# Patient Record
Sex: Female | Born: 1950 | Race: White | Hispanic: No | Marital: Married | Smoking: Never smoker
Health system: Southern US, Community
[De-identification: ages and names within clinical notes are randomized; demographics above are authoritative.]

## PROBLEM LIST (undated history)

## (undated) DIAGNOSIS — E119 Type 2 diabetes mellitus without complications: Secondary | ICD-10-CM

## (undated) DIAGNOSIS — E7800 Pure hypercholesterolemia, unspecified: Secondary | ICD-10-CM

## (undated) HISTORY — DX: Pure hypercholesterolemia, unspecified: E78.00

## (undated) HISTORY — DX: Type 2 diabetes mellitus without complications: E11.9

## (undated) HISTORY — PX: ROTATOR CUFF REPAIR: SHX139

---

## 1999-01-22 ENCOUNTER — Other Ambulatory Visit: Admission: RE | Admit: 1999-01-22 | Discharge: 1999-01-22 | Payer: Self-pay | Admitting: Internal Medicine

## 1999-10-08 HISTORY — PX: ROTATOR CUFF REPAIR: SHX139

## 2001-04-13 ENCOUNTER — Other Ambulatory Visit: Admission: RE | Admit: 2001-04-13 | Discharge: 2001-04-13 | Payer: Self-pay | Admitting: Internal Medicine

## 2002-09-20 ENCOUNTER — Encounter: Payer: Self-pay | Admitting: Internal Medicine

## 2006-03-11 ENCOUNTER — Ambulatory Visit: Payer: Self-pay | Admitting: Internal Medicine

## 2009-02-15 ENCOUNTER — Ambulatory Visit: Payer: Self-pay | Admitting: Internal Medicine

## 2009-02-15 ENCOUNTER — Observation Stay (HOSPITAL_COMMUNITY): Admission: EM | Admit: 2009-02-15 | Discharge: 2009-02-16 | Payer: Self-pay | Admitting: Cardiovascular Disease

## 2009-03-30 ENCOUNTER — Telehealth: Payer: Self-pay | Admitting: Internal Medicine

## 2009-04-03 ENCOUNTER — Ambulatory Visit: Payer: Self-pay | Admitting: Gastroenterology

## 2009-04-03 DIAGNOSIS — K648 Other hemorrhoids: Secondary | ICD-10-CM

## 2009-04-03 DIAGNOSIS — K625 Hemorrhage of anus and rectum: Secondary | ICD-10-CM

## 2009-04-03 DIAGNOSIS — E119 Type 2 diabetes mellitus without complications: Secondary | ICD-10-CM

## 2009-04-03 HISTORY — DX: Type 2 diabetes mellitus without complications: E11.9

## 2009-04-03 HISTORY — DX: Hemorrhage of anus and rectum: K62.5

## 2009-04-03 HISTORY — DX: Other hemorrhoids: K64.8

## 2009-04-06 LAB — CONVERTED CEMR LAB
Basophils Relative: 1.1 % (ref 0.0–3.0)
Eosinophils Absolute: 0.3 10*3/uL (ref 0.0–0.7)
Eosinophils Relative: 4.6 % (ref 0.0–5.0)
HCT: 34.8 % — ABNORMAL LOW (ref 36.0–46.0)
Hemoglobin: 12.1 g/dL (ref 12.0–15.0)
MCHC: 34.7 g/dL (ref 30.0–36.0)
MCV: 101.1 fL — ABNORMAL HIGH (ref 78.0–100.0)
Monocytes Absolute: 0.5 10*3/uL (ref 0.1–1.0)
Neutro Abs: 3.6 10*3/uL (ref 1.4–7.7)
Neutrophils Relative %: 61.5 % (ref 43.0–77.0)
RBC: 3.44 M/uL — ABNORMAL LOW (ref 3.87–5.11)
WBC: 6 10*3/uL (ref 4.5–10.5)

## 2009-04-21 ENCOUNTER — Ambulatory Visit: Payer: Self-pay | Admitting: Gastroenterology

## 2009-04-24 LAB — CONVERTED CEMR LAB
Fecal Occult Blood: NEGATIVE
OCCULT 2: NEGATIVE
OCCULT 5: NEGATIVE

## 2011-01-15 LAB — GLUCOSE, CAPILLARY
Glucose-Capillary: 211 mg/dL — ABNORMAL HIGH (ref 70–99)
Glucose-Capillary: 239 mg/dL — ABNORMAL HIGH (ref 70–99)
Glucose-Capillary: 27 mg/dL — CL (ref 70–99)
Glucose-Capillary: 54 mg/dL — ABNORMAL LOW (ref 70–99)

## 2011-01-15 LAB — CARDIAC PANEL(CRET KIN+CKTOT+MB+TROPI)
CK, MB: 1.4 ng/mL (ref 0.3–4.0)
CK, MB: 1.5 ng/mL (ref 0.3–4.0)
Relative Index: INVALID (ref 0.0–2.5)
Relative Index: INVALID (ref 0.0–2.5)
Total CK: 45 U/L (ref 7–177)
Troponin I: 0.01 ng/mL (ref 0.00–0.06)
Troponin I: 0.01 ng/mL (ref 0.00–0.06)

## 2011-01-15 LAB — LIPID PANEL
HDL: 57 mg/dL (ref >39)
Total CHOL/HDL Ratio: 3 RATIO
VLDL: 7 mg/dL (ref 0–40)

## 2011-01-15 LAB — CBC
RBC: 3.53 MIL/uL — ABNORMAL LOW (ref 3.87–5.11)
WBC: 4.5 10*3/uL (ref 4.0–10.5)

## 2011-02-19 NOTE — Cardiovascular Report (Signed)
NAMEBEVERLYN, MCGINNESS NO.:  [id number]   MEDICAL RECORD NO.:  [id number]          PATIENT TYPE:  INP   LOCATION:  2039                         FACILITY:  [facility]   PHYSICIAN:  Vesta Mixer, M.D. DATE OF BIRTH:  [birth year]   DATE OF PROCEDURE:  02/16/2009  DATE OF DISCHARGE:  02/16/2009                            CARDIAC CATHETERIZATION   Deborah [name] is a 60 year old female with a 31-year history of diabetes  mellitus.  She presents last night with a 2-week episode of intermittent  chest pain.  She was admitted to my service for further evaluation.  Because of risk factors and the persistent nature the pain, we have  decided to do a heart catheterization for further evaluation.   Right femoral artery was easily cannulated using modified Seldinger  technique.   HEMODYNAMICS:  The blood pressure was 90/43.  Her LV pressure was 88/15.  The aortic pressure was 88/43.   ANGIOGRAPHY:  The left main:  The left main is smooth and normal.   The left anterior descending artery is smooth and normal.  It gives off  several small diagonal arteries, which are all normal.   The left circumflex artery is fairly large vessel.  It gives off a large  first obtuse marginal branch, which is somewhat tortuous but is  otherwise normal.  The remaining left circumflex artery is tortuous but  is otherwise normal.   The right coronary artery has a very anterior takeoff.  It is smooth and  normal throughout its course.  The posterior descending artery and the  posterolateral segment of the artery are normal.   The left ventriculogram was performed in a 30-RAO position.  It reveals  vigorous left ventricular systolic function.  Ejection fraction 65-70%.  There are no segmental wall motion abnormalities.   COMPLICATIONS:  None.   CONCLUSIONS:  1. Smooth and normal coronary arteries.  2. Normal left ventricular systolic function.  She will continue with      medical therapy.   She should be able to be discharged to home      tonight.      Vesta Mixer, M.D.  Electronically Signed     PJN/MEDQ  D:  02/16/2009  T:  02/17/2009  Job:  [id number]   cc:   Gwen Pounds, MD

## 2011-02-19 NOTE — Discharge Summary (Signed)
NAMEMELLISA, [name] NO.:  [id number]   MEDICAL RECORD NO.:  [id number]          PATIENT TYPE:  INP   LOCATION:  2039                         FACILITY:  [facility]   PHYSICIAN:  Vesta Mixer, M.D. DATE OF BIRTH:  [birth year]   DATE OF ADMISSION:  02/15/2009  DATE OF DISCHARGE:  02/16/2009                               DISCHARGE SUMMARY   DISCHARGE DIAGNOSES:  1. Noncardiac chest pain.  2. Type 2 diabetes mellitus.  3. Dyslipidemia.   DISCHARGE MEDICATIONS:  1. Lipitor 20 mg a day.  2. Monopril 5 mg a day.  3. Aspirin 81 mg a day.  4. Insulin pump as directed by Dr. Timothy Lasso.   DISPOSITION:  The patient will see Dr. Elease Hashimoto in 1 week for a groin  check.  She will see Dr. Timothy Lasso for further medical problems.   HISTORY:  Deborah [name] is a 60 year old female with a 2-week history of  chest discomfort.  She was originally scheduled to see me in the office  today, but was admitted last night through the emergency room.  She was  admitted in transfer from [facility].  Please see dictated H&P  for further details.   HOSPITAL COURSE:  1. Chest pain.  The patient ruled out for myocardial infarction.      Because of her history of severe diabetes and a family history of      coronary artery disease, we elected to proceed with heart      catheterization.  She had a heart catheterization which revealed      smooth and normal coronary arteries.  She had normal left      ventricular systolic function.  She will follow up with Dr. Elease Hashimoto      for a groin check in a week or so.  2. Diabetes mellitus.  The patient's glucose is fairly labile.  We      discontinued the insulin pump transiently to do a heart      catheterization, but because she had been n.p.o. her sugar dropped      to 64 and eventually 27.  She was given D50.  Her IV hydration was      changed to D5 normal saline.  The patient's followup glucose levels      were 211.  We will continue to check her glucoses  through the day      since she has not eaten regularly.  This will be followed up by Dr.      Timothy Lasso.  All of her other medical problems remained stable.      Vesta Mixer, M.D.  Electronically Signed     PJN/MEDQ  D:  02/16/2009  T:  02/17/2009  Job:  [id number]   cc:   Gwen Pounds, MD

## 2011-02-19 NOTE — H&P (Signed)
Deborah [name], Deborah [name] NO.:  [id number]   MEDICAL RECORD NO.:  [id number]          PATIENT TYPE:  INP   LOCATION:  2922                         FACILITY:  [facility]   PHYSICIAN:  Wendi Snipes, MD DATE OF BIRTH:  [birth year]   DATE OF ADMISSION:  02/15/2009  DATE OF DISCHARGE:                              HISTORY & PHYSICAL   PRIMARY CARE PHYSICIAN:  Gwen Pounds, MD   CHIEF COMPLAINT:  Chest pain.   HISTORY OF PRESENT ILLNESS:  This is a 60 year old white female with a  history of insulin-dependent diabetes and hyperlipidemia who presents  with chest pain to [facility] today.  She states that she has  been experiencing left chest wall pain for the past 2 weeks.  She states  that these particular symptoms are intermittent, not associated with  exertion, palpitations, syncope, presyncope, increased lower extremity  edema and has some component of a pleuritic nature.  She was scheduled  to see Dr. Elease Hashimoto tomorrow for these symptoms.  However, today she  experienced new symptoms that are described as mid chest pressure that  occurred at rest.  The symptoms were quite severe and it prompted the  patient to report to the emergency department for further evaluation  management.   PAST MEDICAL HISTORY:  1. Hyperlipidemia.  2. Insulin-dependent diabetes.   ALLERGIES:  NO KNOWN DRUG ALLERGIES.   MEDICATIONS ON ADMISSION:  1. Monopril 5 mg daily.  2. Lipitor 20 mg daily.  3. Aspirin 81 mg daily.  4. Insulin pump Humalog as directed.   SOCIAL HISTORY:  She lives in [place].  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   FAMILY HISTORY:  Her father had a coronary artery bypass surgery in his  78s, and her grandfather had an MI when he was 34 years old   REVIEW OF SYSTEMS:  All 14-systems were reviewed and were negative  except as mentioned in detail in HPI.   PHYSICAL EXAMINATION:  VITAL SIGNS:  Blood pressure is 98/51, pulse is  62  beats per minute.  She is breathing 16 times per minute.  She is  afebrile.  She is satting 100% on 2 liters nasal cannula.  GENERAL:  She is a 60 year old white female appearing stated age.  No  acute distress.  HEENT:  Moist mucous membranes.  Pupils are equal, round, react to light  and accommodation.  Anicteric sclera.  NECK:  No jugular venous distention.  No thyromegaly.  CARDIOVASCULAR:  Regular rate and rhythm.  No murmurs, rubs or gallops.  LUNGS:  Clear to auscultation bilaterally.  ABDOMEN:  Nontender, nondistended.  Positive bowel sounds.  No masses.  EXTREMITIES:  No clubbing, cyanosis, edema, 2+ pulses throughout.  NEUROLOGIC:  Alert and x3.  Cranial nerves II-XII grossly intact.  No  focal neurologic deficits.  SKIN:  Warm, dry and intact.  No rashes.  PSYCHIATRIC:  Mood and affect are appropriate.   RADIOLOGY:  Chest x-ray in outside hospital was within normal limits.  No acute process.  EKG showed normal sinus rhythm with  a rate of 69  beats per minute with no ST or T-wave abnormalities suggestive of  chronic or recent ischemia.  Normal EKG.   LABORATORY DATA:  White blood cell count is 5.8, hematocrit is 34,  platelet count is 198.  Potassium is 4.4, creatinine is 0.74.  Her blood  sugar of 77.  Her D-dimer is 119.  Her BNP is 61.  Her INR is 1.0.   ASSESSMENT/PLAN:  A 60 year old white female with diabetes and  hyperlipidemia here with chest pain that is somewhat atypical though  concerning in context of her risk factors.  1. Chest pain.  Will treat her as unstable angina.  At the time being,      will start enoxaparin and rule out MI with serial cardiac enzymes.      Will plan for noninvasive risk stratification in the morning if she      does rule out.  Her left chest wall pain is slightly pleuritic and      may represent etiology other than coronary artery disease. Consider      further out patient follow up for non-cardiac chest pain.  2. Hyperlipidemia.  She  is currently on statin therapy.  Will check a      morning fasting lipid panel.  3. Insulin-dependent diabetes.  Will continue her current dose of her      insulin pump.  The patient reports that her A1c is approximately 7.      Wendi Snipes, MD  Electronically Signed     BHH/MEDQ  D:  02/15/2009  T:  02/16/2009  Job:  [id number]

## 2013-08-17 ENCOUNTER — Encounter: Payer: Self-pay | Admitting: Internal Medicine

## 2013-10-22 ENCOUNTER — Ambulatory Visit (AMBULATORY_SURGERY_CENTER): Payer: Self-pay

## 2013-10-22 ENCOUNTER — Telehealth: Payer: Self-pay

## 2013-10-22 VITALS — Ht 64.0 in | Wt 185.0 lb

## 2013-10-22 DIAGNOSIS — Z1211 Encounter for screening for malignant neoplasm of colon: Secondary | ICD-10-CM

## 2013-10-22 MED ORDER — MOVIPREP 100 G PO SOLR: 1.0000 | Freq: Once | ORAL | Status: DC

## 2013-10-22 NOTE — Telephone Encounter (Signed)
Pt had PV on 10/22/13, colon on the 30th of Jan with DR Olevia Perches.  Pt has insulin pump.  Please check with Shon Baton and contact pt regarding her instructions for the insulin pump.  Thank you!

## 2013-10-22 NOTE — Telephone Encounter (Signed)
Letter sent to Dr. Virgina Jock.

## 2013-10-27 NOTE — Telephone Encounter (Signed)
Spoke with Melissa at Dr. Keane Police office and faxed letter to [phone].

## 2013-10-28 ENCOUNTER — Encounter: Payer: Self-pay | Admitting: Internal Medicine

## 2013-11-01 NOTE — Telephone Encounter (Signed)
Received faxed letter from Dr. Virgina Jock with instructions for patient to go to 50% basal at 8 PM the night before. Bolus gently when CBG's >250.

## 2013-11-01 NOTE — Telephone Encounter (Signed)
Spoke with patient and reviewed instructions with her. Copy to Lillie Columbia for endo chart and sent letter to be scanned in also.

## 2013-11-05 ENCOUNTER — Telehealth: Payer: Self-pay | Admitting: *Deleted

## 2013-11-05 ENCOUNTER — Encounter: Payer: Self-pay | Admitting: Internal Medicine

## 2013-11-05 ENCOUNTER — Other Ambulatory Visit: Payer: Self-pay | Admitting: *Deleted

## 2013-11-05 ENCOUNTER — Ambulatory Visit (AMBULATORY_SURGERY_CENTER): Payer: Managed Care, Other (non HMO) | Admitting: Internal Medicine

## 2013-11-05 ENCOUNTER — Ambulatory Visit
Admission: RE | Admit: 2013-11-05 | Discharge: 2013-11-05 | Disposition: A | Discharge status: Home / Self Care | Payer: Managed Care, Other (non HMO) | Source: Ambulatory Visit | Attending: Internal Medicine | Admitting: Internal Medicine

## 2013-11-05 VITALS — BP 121/64 | HR 69 | Temp 97.7°F | Resp 16 | Ht 64.0 in | Wt 185.0 lb

## 2013-11-05 DIAGNOSIS — Q438 Other specified congenital malformations of intestine: Secondary | ICD-10-CM

## 2013-11-05 DIAGNOSIS — Z1211 Encounter for screening for malignant neoplasm of colon: Secondary | ICD-10-CM

## 2013-11-05 IMAGING — RF DG BE W/ CM - WO/W KUB
18 of 24 series · 18 of 24 positions shown · non-contrast
Comparison: None.

FLUOROSCOPY TIME:  1 min 48 seconds.

CLINICAL DATA: Incomplete colonoscopy.

EXAM:
SINGLE COLUMN BARIUM ENEMA
TECHNIQUE: Initial scout AP supine abdominal image obtained to insure adequate
colon cleansing. Barium was introduced into the colon in a
retrograde fashion and refluxed from the rectum to the cecum. Spot
images of the colon followed by overhead radiographs were obtained.

[Series 1: run · 1 of 1 slices shown (1 of 17)]
[im 1/1]
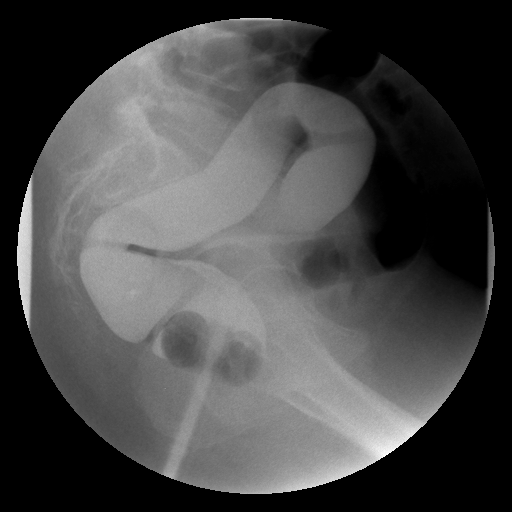

[Series 2: run · 1 of 1 slices shown (2 of 17)]
[im 1/1]
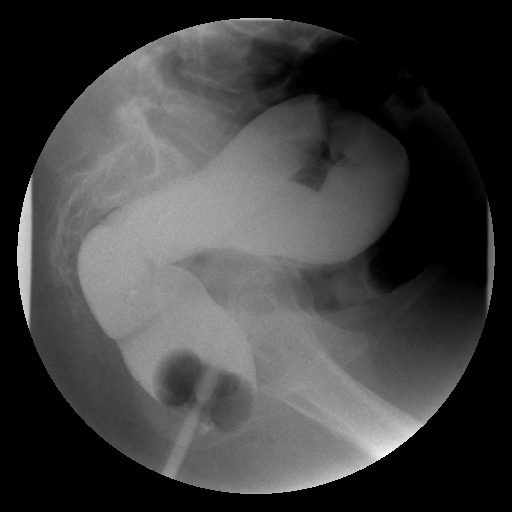

[Series 3: run · 1 of 1 slices shown (3 of 17)]
[im 1/1]
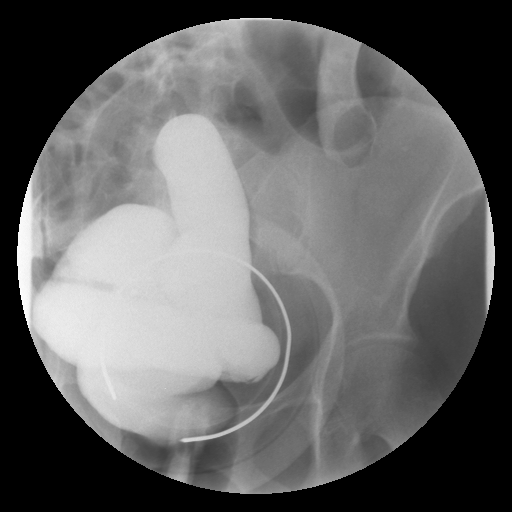

[Series 4: run · 1 of 1 slices shown (4 of 17)]
[im 1/1]
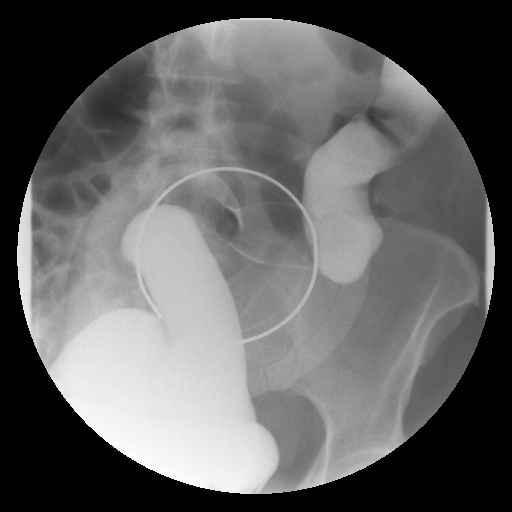

[Series 5: run · 1 of 1 slices shown (5 of 17)]
[im 1/1]
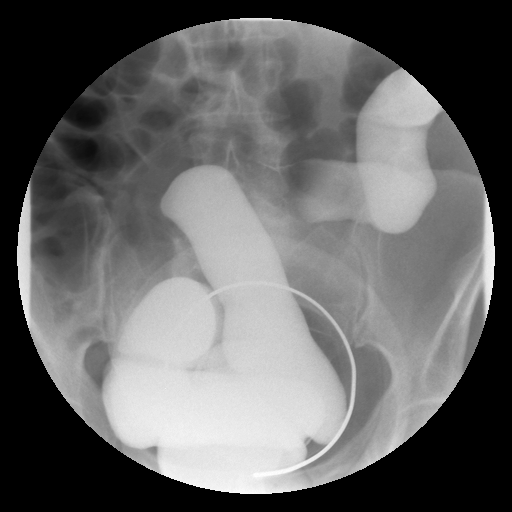

[Series 6: run · 1 of 1 slices shown (6 of 17)]
[im 1/1]
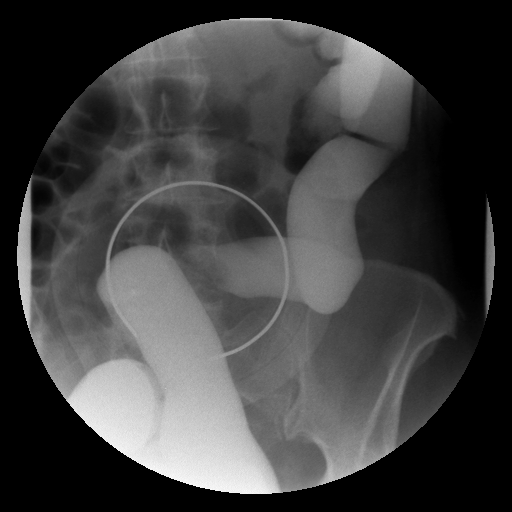

[Series 7: run · 1 of 1 slices shown (7 of 17)]
[im 1/1]
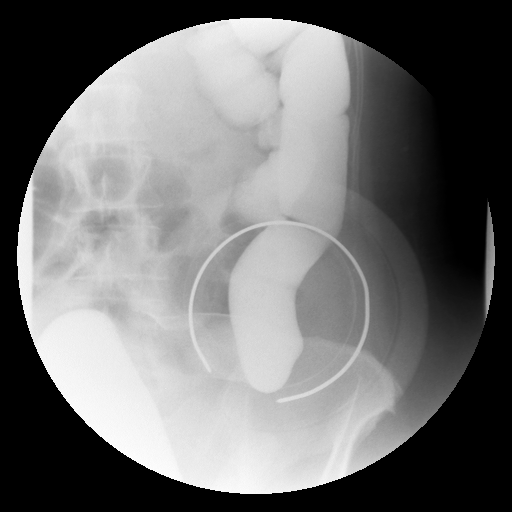

[Series 8: run · 1 of 1 slices shown (8 of 17)]
[im 1/1]
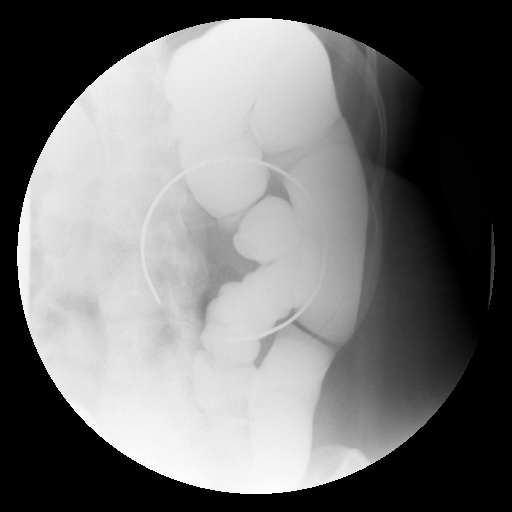

[Series 9: run · 1 of 1 slices shown (9 of 17)]
[im 1/1]
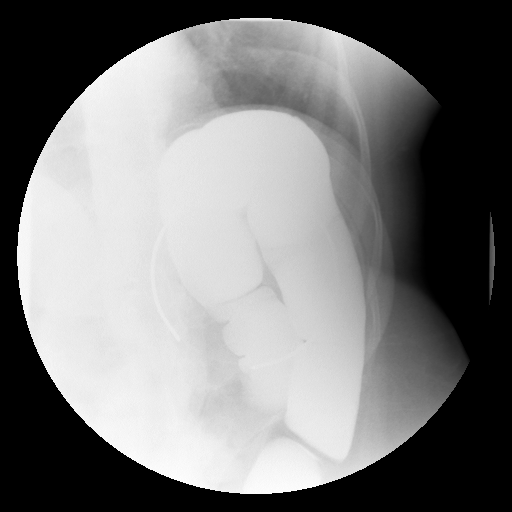

[Series 10: run · 1 of 1 slices shown (10 of 17)]
[im 1/1]
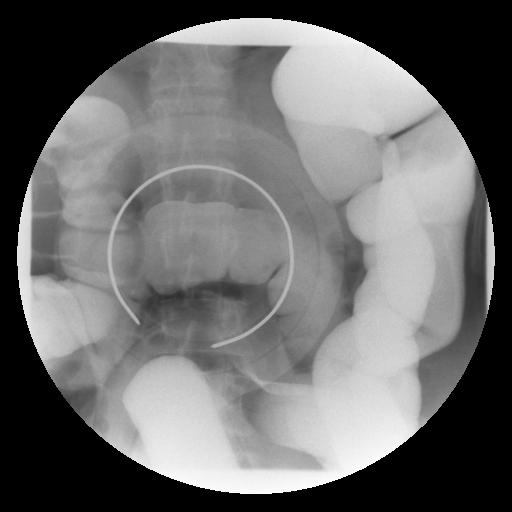

[Series 11: run · 1 of 1 slices shown (11 of 17)]
[im 1/1]
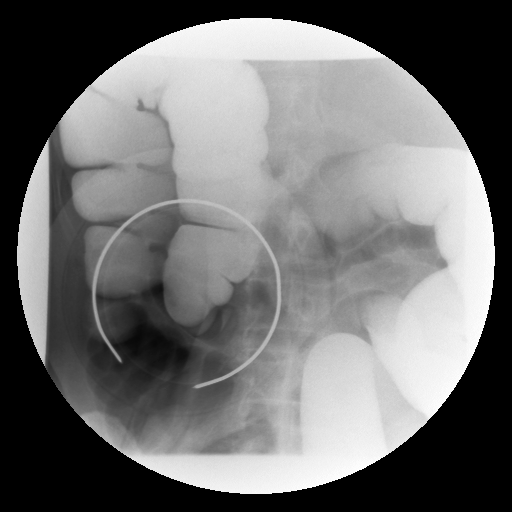

[Series 12: run · 1 of 1 slices shown (12 of 17)]
[im 1/1]
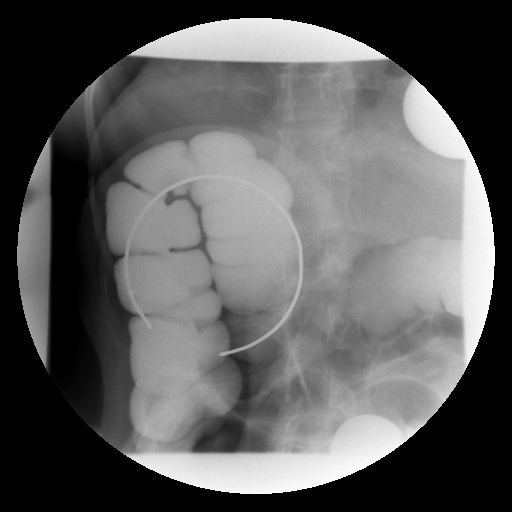

[Series 13: run · 1 of 1 slices shown (13 of 17)]
[im 1/1]
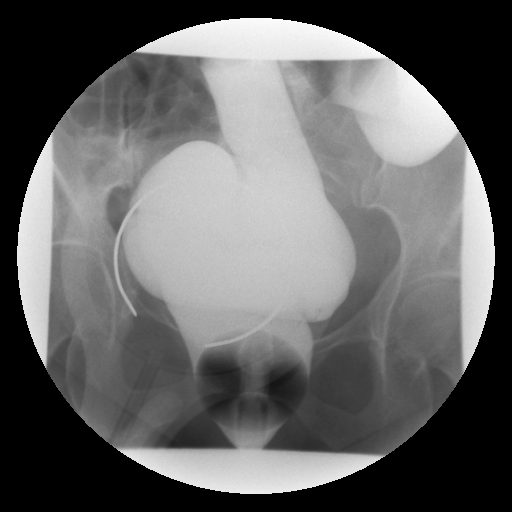

[Series 14: run · 1 of 1 slices shown (14 of 17)]
[im 1/1]
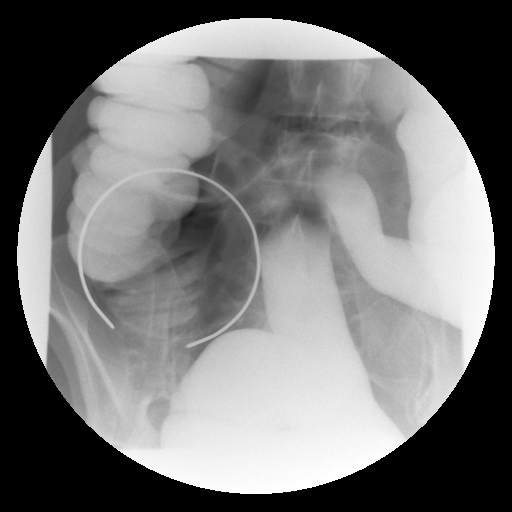

[Series 15: run · 1 of 1 slices shown (15 of 17)]
[im 1/1]
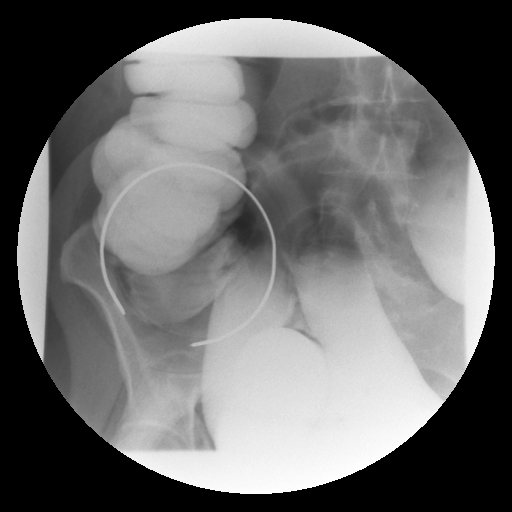

[Series 16: run · 1 of 1 slices shown (16 of 17)]
[im 1/1]
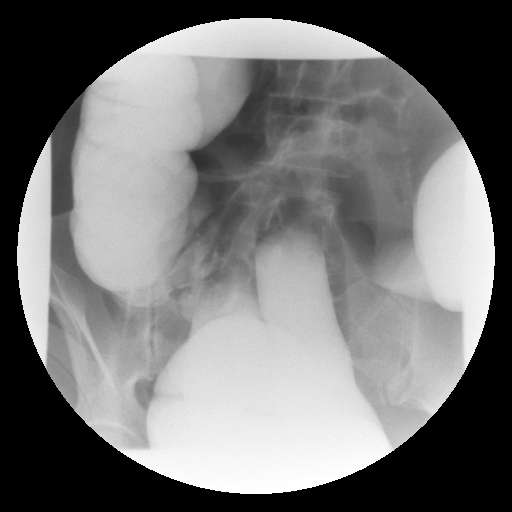

[Series 17: run · 1 of 1 slices shown (17 of 17)]
[im 1/1]
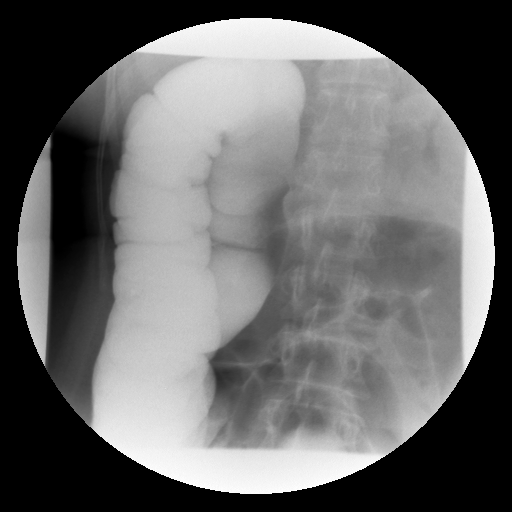

[Series 1001: view not recorded · 0.20mm/px · 1 of 1 slices shown]
[im 1/1]
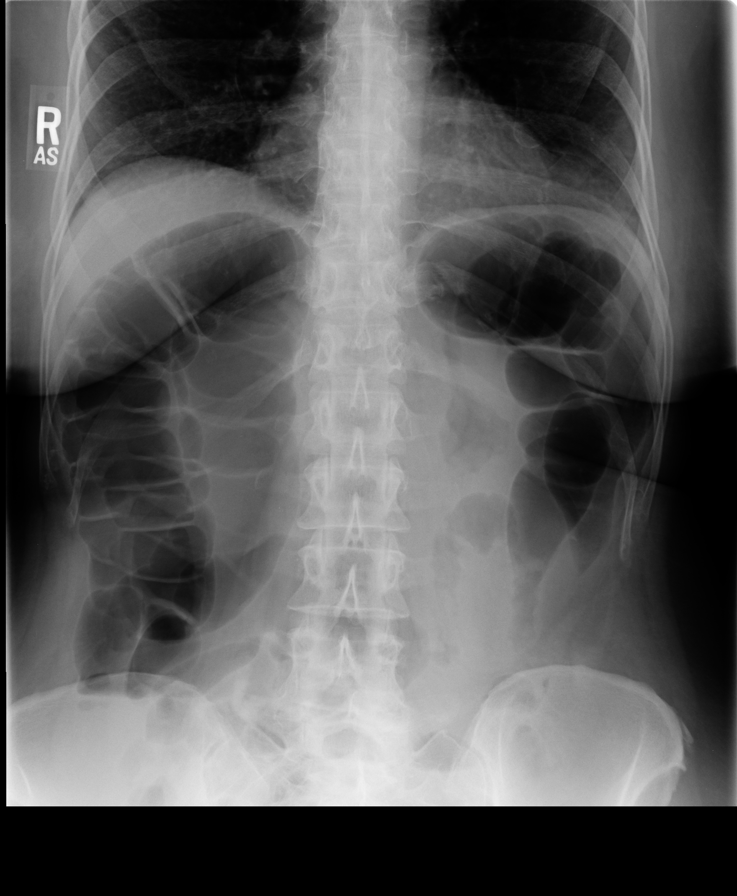

[18 of 24 positions shown; findings below may reference images not displayed]

FINDINGS: Supine and erect views obtained prior to the procedure demonstrate
moderate gas throughout the colon. There is no free intraperitoneal
air.

Contrast was refluxed throughout the colon. The colon is mildly
redundant. Barium coating was somewhat limited by the pre-existing
gas. No focal polyp, stricture or ulceration is identified. There
was partial reflux of the terminal ileum. The appendix is not
visualized. A post evacuation view demonstrates a normal mucosal
relief pattern.
IMPRESSION: Colonic redundancy without demonstrated focal mucosal lesion.

## 2013-11-05 MED ORDER — FLEET ENEMA 7-19 GM/118ML RE ENEM
1.0000 | ENEMA | Freq: Once | RECTAL | Status: AC
  Administered 2013-11-05: 1 via RECTAL

## 2013-11-05 MED ORDER — SODIUM CHLORIDE 0.9 % IV SOLN: 500.0000 mL | INTRAVENOUS | Status: DC

## 2013-11-05 NOTE — Progress Notes (Signed)
11:55 I was removing electrods from th pt left lower side and accidentally removed her insulin pump.  I advised the pt of this.  She asked my to wipe the site with alcohol, witch I used the prep wipe we have.  I appoligized to her repeatly.  I aslo advised Dr. Olevia Perches this happened.  Dr. Olevia Perches spoke with the pt and her husband her self.  I olled for [facility], RN unable to find her at this time to report incident. Maw

## 2013-11-05 NOTE — Progress Notes (Signed)
Pt. Had fleets enema, clear light brown results noted.

## 2013-11-05 NOTE — Op Note (Signed)
[name], [id number]   COLONOSCOPY PROCEDURE REPORT  PATIENT: Deborah, [name]  MR#: [id number] BIRTHDATE: 12-30-1950 , 88  yrs. old GENDER: Female ENDOSCOPIST: Lafayette Dragon, MD REFERRED WH:QPRF Virgina Jock, M.D. PROCEDURE DATE:  11/05/2013 PROCEDURE:   Colonoscopy, screening ,incomplete First Screening Colonoscopy - Avg.  risk and is 50 yrs.  old or older - No.  Prior Negative Screening - Now for repeat screening. 10 or more years since last screening  History of Adenoma - Now for follow-up colonoscopy & has been > or = to 3 yrs.  N/A  Polyps Removed Today? No.  Recommend repeat exam, <10 yrs? No. ASA CLASS:   Class II INDICATIONS:Average risk patient for colon cancer and last colonoscopy in December 2003 was incomplete.  Cecal pouch not visualized due to tortuous colon. MEDICATIONS: MAC sedation, administered by CRNA and propofol (Diprivan) 400mg  IV  DESCRIPTION OF PROCEDURE:   After the risks benefits and alternatives of the procedure were thoroughly explained, informed consent was obtained.  A digital rectal exam revealed no abnormalities of the rectum.   The [id number]  endoscope was introduced through the anus and advanced to the hepatic flexure. No adverse events experienced.   The quality of the prep was Moviprep fair  The instrument was then slowly withdrawn as the colon was fully examined.  Retroflexed views revealed no abnormalities. The time to cecum=30 minutes 0 seconds.  Withdrawal time=6 minutes 0 seconds. The scope was withdrawn and the procedure completed. COMPLICATIONS: There were no complications.  ENDOSCOPIC IMPRESSION: incomplete colonoscopy to hepatic flexure due to tortuosity. This is the second time we have not been able to visualize the cecum. The left and transverse colon appeared normal  RECOMMENDATIONS: Barium enema today while she is still prepped High fiber diet In the future I suggest virtual  colonoscopy for colorectal screening   eSigned:  Lafayette Dragon, MD 11/05/2013 11:27 AM   cc:

## 2013-11-05 NOTE — Progress Notes (Signed)
Procedure ends, to recovery, report given and VSS. 

## 2013-11-05 NOTE — Telephone Encounter (Signed)
Per Dr. Olevia Perches patient needs Barium Enema no air today. Tortuous colon incomplete colonoscopy. Unable to get scheduled at [facility] or [facility]. Spoke with Judeen Hammans at [facility] and scheduled today at 1:15 PM. Anne Ng in Recovery notified.

## 2013-11-05 NOTE — Patient Instructions (Addendum)
YOU HAD AN ENDOSCOPIC PROCEDURE TODAY AT THE [facility]: Refer to the procedure report that was given to you for any specific questions about what was found during the examination.  If the procedure report does not answer your questions, please call your gastroenterologist to clarify.  If you requested that your care partner not be given the details of your procedure findings, then the procedure report has been included in a sealed envelope for you to review at your convenience later.  YOU SHOULD EXPECT: Some feelings of bloating in the abdomen. Passage of more gas than usual.  Walking can help get rid of the air that was put into your GI tract during the procedure and reduce the bloating. If you had a lower endoscopy (such as a colonoscopy or flexible sigmoidoscopy) you may notice spotting of blood in your stool or on the toilet paper. If you underwent a bowel prep for your procedure, then you may not have a normal bowel movement for a few days.  DIET: Your first meal following the procedure should be a light meal and then it is ok to progress to your normal diet.  A half-sandwich or bowl of soup is an example of a good first meal.  Heavy or fried foods are harder to digest and may make you feel nauseous or bloated.  Likewise meals heavy in dairy and vegetables can cause extra gas to form and this can also increase the bloating.  Drink plenty of fluids but you should avoid alcoholic beverages for 24 hours.  ACTIVITY: Your care partner should take you home directly after the procedure.  You should plan to take it easy, moving slowly for the rest of the day.  You can resume normal activity the day after the procedure however you should NOT DRIVE or use heavy machinery for 24 hours (because of the sedation medicines used during the test).    SYMPTOMS TO REPORT IMMEDIATELY: A gastroenterologist can be reached at any hour.  During normal business hours, 8:30 AM to 5:00 PM Monday through Friday,  call [phone].  After hours and on weekends, please call the GI answering service at [phone] who will take a message and have the physician on call contact you.   Following lower endoscopy (colonoscopy or flexible sigmoidoscopy):  Excessive amounts of blood in the stool  Significant tenderness or worsening of abdominal pains  Swelling of the abdomen that is new, acute  Fever of 100F or higher   FOLLOW UP: If any biopsies were taken you will be contacted by phone or by letter within the next 1-3 weeks.  Call your gastroenterologist if you have not heard about the biopsies in 3 weeks.  Our staff will call the home number listed on your records the next business day following your procedure to check on you and address any questions or concerns that you may have at that time regarding the information given to you following your procedure. This is a courtesy call and so if there is no answer at the home number and we have not heard from you through the emergency physician on call, we will assume that you have returned to your regular daily activities without incident.  SIGNATURES/CONFIDENTIALITY: You and/or your care partner have signed paperwork which will be entered into your electronic medical record.  These signatures attest to the fact that that the information above on your After Visit Summary has been reviewed and is understood.  Full responsibility of the confidentiality of   this discharge information lies with you and/or your care-partner.    Pt to remain nothing to eat or drink until after to barium enema done today at [facility] at 1:15. High fiber diet with liberal fluid intake.  You may resume your current medications today. Please call if any questions or concerns.

## 2013-11-05 NOTE — Progress Notes (Signed)
Insulin pump site was cleaned with prep pad and I offered to apply a clean dressing to site.  Pt declined.  I report the incident to [facility], RN after the pt was discharged.

## 2013-11-08 ENCOUNTER — Telehealth: Payer: Self-pay | Admitting: *Deleted

## 2013-11-08 NOTE — Telephone Encounter (Signed)
Left message rhat we called for f/u

## 2014-02-17 ENCOUNTER — Ambulatory Visit (INDEPENDENT_AMBULATORY_CARE_PROVIDER_SITE_OTHER): Payer: Managed Care, Other (non HMO)

## 2014-02-17 VITALS — BP 127/68 | HR 79 | Resp 18

## 2014-02-17 DIAGNOSIS — B351 Tinea unguium: Secondary | ICD-10-CM

## 2014-02-17 DIAGNOSIS — S90129A Contusion of unspecified lesser toe(s) without damage to nail, initial encounter: Secondary | ICD-10-CM

## 2014-02-17 DIAGNOSIS — M79609 Pain in unspecified limb: Secondary | ICD-10-CM

## 2014-02-17 MED ORDER — TAVABOROLE 5 % EX SOLN: CUTANEOUS | Status: DC

## 2014-02-17 NOTE — Progress Notes (Signed)
   Subjective:    Patient ID: Deborah [name], female    DOB: 1950-10-16, 63 y.o.   MRN: [id number]  HPI my big two big toenails have some fungus to them and the one on the right is discolored and thick and the left is ok     Review of Systems  Endocrine: Positive for cold intolerance.  Skin:       Change in nails  All other systems reviewed and are negative.      Objective:   Physical Exam Okay objective findings follows pedal pulses are palpable DP +2/4 PT plus one over 4 capillary refill time 3 seconds epicritic and proprioceptive sensations intact and symmetric bilateral patient starting to have some early possible neuropathy she is diabetic insulin-dependent on a pump last A1c was 6.7. There is no other abnormalities noted skin color texture turgor otherwise unremarkable nails unremarkable except for hallux nails bilateral right more severe left with kyphosis discoloration yellow-brown discoloration right hallux yellowing of the left hallux was and friability noted. There is history of injury or contusion in the past although no definite reported episode is noted. Orthopedic biomechanical exam rectus foot type ankle mid tarsus subtalar joint motions normal mild flexible digital contractures are noted       Assessment & Plan:  Assessment this time his diabetes with peripheral neuropathy early stages otherwise unremarkable no complications at this time does have thick and brittle crumbly mycotic nails hallux bilateral right more so than left this time hallux nails treated for patient request and initiated prescription for topical nail antifungal forward [facility] patient applied topical nail antifungal was daily to each affected hallux nail for its 12 months duration as instructed followup in 6-12 months on an as-needed basis for reevaluation next  Harriet Masson DPM

## 2014-02-17 NOTE — Patient Instructions (Signed)
Onychomycosis/Fungal Toenails  WHAT IS IT? An infection that lies within the keratin of your nail plate that is caused by a fungus.  WHY ME? Fungal infections affect all ages, sexes, races, and creeds.  There may be many factors that predispose you to a fungal infection such as age, coexisting medical conditions such as diabetes, or an autoimmune disease; stress, medications, fatigue, genetics, etc.  Bottom line: fungus thrives in a warm, moist environment and your shoes offer such a location.  IS IT CONTAGIOUS? Theoretically, yes.  You do not want to share shoes, nail clippers or files with someone who has fungal toenails.  Walking around barefoot in the same room or sleeping in the same bed is unlikely to transfer the organism.  It is important to realize, however, that fungus can spread easily from one nail to the next on the same foot.  HOW DO WE TREAT THIS?  There are several ways to treat this condition.  Treatment may depend on many factors such as age, medications, pregnancy, liver and kidney conditions, etc.  It is best to ask your doctor which options are available to you.  1. No treatment.   Unlike many other medical concerns, you can live with this condition.  However for many people this can be a painful condition and may lead to ingrown toenails or a bacterial infection.  It is recommended that you keep the nails cut short to help reduce the amount of fungal nail. 2. Topical treatment.  These range from herbal remedies to prescription strength nail lacquers.  About 40-50% effective, topicals require twice daily application for approximately 9 to 12 months or until an entirely new nail has grown out.  The most effective topicals are medical grade medications available through physicians offices. 3. Oral antifungal medications.  With an 80-90% cure rate, the most common oral medication requires 3 to 4 months of therapy and stays in your system for a year as the new nail grows out.  Oral  antifungal medications do require blood work to make sure it is a safe drug for you.  A liver function panel will be performed prior to starting the medication and after the first month of treatment.  It is important to have the blood work performed to avoid any harmful side effects.  In general, this medication safe but blood work is required. 4. Laser Therapy.  This treatment is performed by applying a specialized laser to the affected nail plate.  This therapy is noninvasive, fast, and non-painful.  It is not covered by insurance and is therefore, out of pocket.  The results have been very good with a 80-95% cure rate.  [facility] is the only practice in the area to offer this therapy. 5. Permanent Nail Avulsion.  Removing the entire nail so that a new nail will not grow back.  Apply the topical nail antifungal kerydin once daily to each affected toenail as instructed for 12 month

## 2016-03-11 DIAGNOSIS — E784 Other hyperlipidemia: Secondary | ICD-10-CM | POA: Diagnosis not present

## 2016-03-11 DIAGNOSIS — Z6831 Body mass index (BMI) 31.0-31.9, adult: Secondary | ICD-10-CM | POA: Diagnosis not present

## 2016-03-11 DIAGNOSIS — E109 Type 1 diabetes mellitus without complications: Secondary | ICD-10-CM | POA: Diagnosis not present

## 2016-03-21 DIAGNOSIS — E109 Type 1 diabetes mellitus without complications: Secondary | ICD-10-CM | POA: Diagnosis not present

## 2016-05-13 DIAGNOSIS — S62606A Fracture of unspecified phalanx of right little finger, initial encounter for closed fracture: Secondary | ICD-10-CM | POA: Diagnosis not present

## 2016-05-13 DIAGNOSIS — M20031 Swan-neck deformity of right finger(s): Secondary | ICD-10-CM | POA: Diagnosis not present

## 2016-05-15 DIAGNOSIS — S62639A Displaced fracture of distal phalanx of unspecified finger, initial encounter for closed fracture: Secondary | ICD-10-CM

## 2016-05-15 DIAGNOSIS — M20019 Mallet finger of unspecified finger(s): Secondary | ICD-10-CM

## 2016-05-15 HISTORY — DX: Displaced fracture of distal phalanx of unspecified finger, initial encounter for closed fracture: S62.639A

## 2016-05-15 HISTORY — DX: Mallet finger of unspecified finger(s): M20.019

## 2016-05-29 DIAGNOSIS — M25541 Pain in joints of right hand: Secondary | ICD-10-CM | POA: Diagnosis not present

## 2016-05-29 DIAGNOSIS — S62639D Displaced fracture of distal phalanx of unspecified finger, subsequent encounter for fracture with routine healing: Secondary | ICD-10-CM | POA: Diagnosis not present

## 2016-05-29 DIAGNOSIS — S62636D Displaced fracture of distal phalanx of right little finger, subsequent encounter for fracture with routine healing: Secondary | ICD-10-CM | POA: Diagnosis not present

## 2016-05-29 DIAGNOSIS — R52 Pain, unspecified: Secondary | ICD-10-CM | POA: Diagnosis not present

## 2016-06-21 DIAGNOSIS — E109 Type 1 diabetes mellitus without complications: Secondary | ICD-10-CM | POA: Diagnosis not present

## 2016-07-01 DIAGNOSIS — S62639D Displaced fracture of distal phalanx of unspecified finger, subsequent encounter for fracture with routine healing: Secondary | ICD-10-CM | POA: Diagnosis not present

## 2016-07-01 DIAGNOSIS — S62636D Displaced fracture of distal phalanx of right little finger, subsequent encounter for fracture with routine healing: Secondary | ICD-10-CM | POA: Diagnosis not present

## 2016-07-09 DIAGNOSIS — E1140 Type 2 diabetes mellitus with diabetic neuropathy, unspecified: Secondary | ICD-10-CM | POA: Diagnosis not present

## 2016-07-09 DIAGNOSIS — Z6829 Body mass index (BMI) 29.0-29.9, adult: Secondary | ICD-10-CM | POA: Diagnosis not present

## 2016-07-09 DIAGNOSIS — Z23 Encounter for immunization: Secondary | ICD-10-CM | POA: Diagnosis not present

## 2016-07-09 DIAGNOSIS — E109 Type 1 diabetes mellitus without complications: Secondary | ICD-10-CM | POA: Diagnosis not present

## 2016-07-31 DIAGNOSIS — S62639D Displaced fracture of distal phalanx of unspecified finger, subsequent encounter for fracture with routine healing: Secondary | ICD-10-CM | POA: Diagnosis not present

## 2016-07-31 DIAGNOSIS — S62636D Displaced fracture of distal phalanx of right little finger, subsequent encounter for fracture with routine healing: Secondary | ICD-10-CM | POA: Diagnosis not present

## 2016-07-31 DIAGNOSIS — M20019 Mallet finger of unspecified finger(s): Secondary | ICD-10-CM | POA: Diagnosis not present

## 2016-07-31 DIAGNOSIS — M20011 Mallet finger of right finger(s): Secondary | ICD-10-CM | POA: Diagnosis not present

## 2016-09-20 DIAGNOSIS — E109 Type 1 diabetes mellitus without complications: Secondary | ICD-10-CM | POA: Diagnosis not present

## 2016-10-25 DIAGNOSIS — Z1231 Encounter for screening mammogram for malignant neoplasm of breast: Secondary | ICD-10-CM | POA: Diagnosis not present

## 2016-11-01 DIAGNOSIS — M20011 Mallet finger of right finger(s): Secondary | ICD-10-CM | POA: Diagnosis not present

## 2016-11-01 DIAGNOSIS — S62636D Displaced fracture of distal phalanx of right little finger, subsequent encounter for fracture with routine healing: Secondary | ICD-10-CM | POA: Diagnosis not present

## 2016-11-01 DIAGNOSIS — S62639D Displaced fracture of distal phalanx of unspecified finger, subsequent encounter for fracture with routine healing: Secondary | ICD-10-CM | POA: Diagnosis not present

## 2016-11-01 DIAGNOSIS — M20019 Mallet finger of unspecified finger(s): Secondary | ICD-10-CM | POA: Diagnosis not present

## 2016-11-04 DIAGNOSIS — E109 Type 1 diabetes mellitus without complications: Secondary | ICD-10-CM | POA: Diagnosis not present

## 2016-11-04 DIAGNOSIS — E784 Other hyperlipidemia: Secondary | ICD-10-CM | POA: Diagnosis not present

## 2016-11-11 DIAGNOSIS — E109 Type 1 diabetes mellitus without complications: Secondary | ICD-10-CM | POA: Diagnosis not present

## 2016-11-11 DIAGNOSIS — E784 Other hyperlipidemia: Secondary | ICD-10-CM | POA: Diagnosis not present

## 2016-11-11 DIAGNOSIS — E1140 Type 2 diabetes mellitus with diabetic neuropathy, unspecified: Secondary | ICD-10-CM | POA: Diagnosis not present

## 2016-11-11 DIAGNOSIS — Z6831 Body mass index (BMI) 31.0-31.9, adult: Secondary | ICD-10-CM | POA: Diagnosis not present

## 2016-11-11 DIAGNOSIS — Z23 Encounter for immunization: Secondary | ICD-10-CM | POA: Diagnosis not present

## 2016-11-11 DIAGNOSIS — M25519 Pain in unspecified shoulder: Secondary | ICD-10-CM | POA: Diagnosis not present

## 2016-11-11 DIAGNOSIS — Z1389 Encounter for screening for other disorder: Secondary | ICD-10-CM | POA: Diagnosis not present

## 2016-11-11 DIAGNOSIS — Z0000 Encounter for general adult medical examination without abnormal findings: Secondary | ICD-10-CM | POA: Diagnosis not present

## 2016-11-18 DIAGNOSIS — S63615A Unspecified sprain of left ring finger, initial encounter: Secondary | ICD-10-CM | POA: Diagnosis not present

## 2016-11-18 DIAGNOSIS — M25511 Pain in right shoulder: Secondary | ICD-10-CM | POA: Diagnosis not present

## 2016-11-18 DIAGNOSIS — G8929 Other chronic pain: Secondary | ICD-10-CM | POA: Diagnosis not present

## 2016-11-18 DIAGNOSIS — E119 Type 2 diabetes mellitus without complications: Secondary | ICD-10-CM | POA: Diagnosis not present

## 2016-11-25 DIAGNOSIS — E119 Type 2 diabetes mellitus without complications: Secondary | ICD-10-CM | POA: Diagnosis not present

## 2016-11-25 DIAGNOSIS — M25511 Pain in right shoulder: Secondary | ICD-10-CM

## 2016-11-25 DIAGNOSIS — G8929 Other chronic pain: Secondary | ICD-10-CM

## 2016-11-25 HISTORY — DX: Pain in right shoulder: M25.511

## 2016-11-25 HISTORY — DX: Other chronic pain: G89.29

## 2016-12-24 DIAGNOSIS — E113292 Type 2 diabetes mellitus with mild nonproliferative diabetic retinopathy without macular edema, left eye: Secondary | ICD-10-CM | POA: Diagnosis not present

## 2016-12-30 DIAGNOSIS — J324 Chronic pansinusitis: Secondary | ICD-10-CM | POA: Diagnosis not present

## 2017-01-06 DIAGNOSIS — E109 Type 1 diabetes mellitus without complications: Secondary | ICD-10-CM | POA: Diagnosis not present

## 2017-01-11 DIAGNOSIS — J040 Acute laryngitis: Secondary | ICD-10-CM | POA: Diagnosis not present

## 2017-01-21 DIAGNOSIS — J3801 Paralysis of vocal cords and larynx, unilateral: Secondary | ICD-10-CM | POA: Diagnosis not present

## 2017-01-21 DIAGNOSIS — R490 Dysphonia: Secondary | ICD-10-CM | POA: Insufficient documentation

## 2017-01-21 DIAGNOSIS — J3800 Paralysis of vocal cords and larynx, unspecified: Secondary | ICD-10-CM

## 2017-01-21 HISTORY — DX: Dysphonia: R49.0

## 2017-01-21 HISTORY — DX: Paralysis of vocal cords and larynx, unspecified: J38.00

## 2017-01-29 ENCOUNTER — Other Ambulatory Visit: Payer: Self-pay | Admitting: Otolaryngology

## 2017-01-29 DIAGNOSIS — J3800 Paralysis of vocal cords and larynx, unspecified: Secondary | ICD-10-CM

## 2017-01-30 ENCOUNTER — Ambulatory Visit
Admission: RE | Admit: 2017-01-30 | Discharge: 2017-01-30 | Disposition: A | Discharge status: Home / Self Care | Payer: PPO | Source: Ambulatory Visit | Attending: Otolaryngology | Admitting: Otolaryngology

## 2017-01-30 ENCOUNTER — Other Ambulatory Visit: Payer: Self-pay | Admitting: Otolaryngology

## 2017-01-30 DIAGNOSIS — J3800 Paralysis of vocal cords and larynx, unspecified: Secondary | ICD-10-CM

## 2017-01-30 DIAGNOSIS — R490 Dysphonia: Secondary | ICD-10-CM | POA: Diagnosis not present

## 2017-01-30 MED ORDER — IOPAMIDOL (ISOVUE-300) INJECTION 61%
75.0000 mL | Freq: Once | INTRAVENOUS | Status: AC | PRN
  Administered 2017-01-30: 75 mL via INTRAVENOUS

## 2017-02-11 DIAGNOSIS — E049 Nontoxic goiter, unspecified: Secondary | ICD-10-CM | POA: Diagnosis not present

## 2017-02-11 DIAGNOSIS — Z6831 Body mass index (BMI) 31.0-31.9, adult: Secondary | ICD-10-CM | POA: Diagnosis not present

## 2017-02-11 DIAGNOSIS — E109 Type 1 diabetes mellitus without complications: Secondary | ICD-10-CM | POA: Diagnosis not present

## 2017-02-11 DIAGNOSIS — M25519 Pain in unspecified shoulder: Secondary | ICD-10-CM | POA: Diagnosis not present

## 2017-02-11 DIAGNOSIS — J040 Acute laryngitis: Secondary | ICD-10-CM | POA: Diagnosis not present

## 2017-02-11 DIAGNOSIS — E065 Other chronic thyroiditis: Secondary | ICD-10-CM | POA: Diagnosis not present

## 2017-03-24 DIAGNOSIS — Z01419 Encounter for gynecological examination (general) (routine) without abnormal findings: Secondary | ICD-10-CM | POA: Diagnosis not present

## 2017-03-25 DIAGNOSIS — R490 Dysphonia: Secondary | ICD-10-CM | POA: Diagnosis not present

## 2017-03-25 DIAGNOSIS — J3800 Paralysis of vocal cords and larynx, unspecified: Secondary | ICD-10-CM | POA: Diagnosis not present

## 2017-04-11 DIAGNOSIS — E109 Type 1 diabetes mellitus without complications: Secondary | ICD-10-CM | POA: Diagnosis not present

## 2017-05-05 DIAGNOSIS — S90932A Unspecified superficial injury of left great toe, initial encounter: Secondary | ICD-10-CM | POA: Diagnosis not present

## 2017-05-26 DIAGNOSIS — R490 Dysphonia: Secondary | ICD-10-CM | POA: Diagnosis not present

## 2017-06-05 DIAGNOSIS — Z23 Encounter for immunization: Secondary | ICD-10-CM | POA: Diagnosis not present

## 2017-06-05 DIAGNOSIS — M79605 Pain in left leg: Secondary | ICD-10-CM | POA: Diagnosis not present

## 2017-06-05 DIAGNOSIS — E1140 Type 2 diabetes mellitus with diabetic neuropathy, unspecified: Secondary | ICD-10-CM | POA: Diagnosis not present

## 2017-06-05 DIAGNOSIS — Z6831 Body mass index (BMI) 31.0-31.9, adult: Secondary | ICD-10-CM | POA: Diagnosis not present

## 2017-06-30 DIAGNOSIS — E113292 Type 2 diabetes mellitus with mild nonproliferative diabetic retinopathy without macular edema, left eye: Secondary | ICD-10-CM | POA: Diagnosis not present

## 2017-07-21 DIAGNOSIS — E109 Type 1 diabetes mellitus without complications: Secondary | ICD-10-CM | POA: Diagnosis not present

## 2017-08-11 DIAGNOSIS — Z794 Long term (current) use of insulin: Secondary | ICD-10-CM | POA: Diagnosis not present

## 2017-08-11 DIAGNOSIS — Z6831 Body mass index (BMI) 31.0-31.9, adult: Secondary | ICD-10-CM | POA: Diagnosis not present

## 2017-08-11 DIAGNOSIS — E109 Type 1 diabetes mellitus without complications: Secondary | ICD-10-CM | POA: Diagnosis not present

## 2017-08-11 DIAGNOSIS — E113293 Type 2 diabetes mellitus with mild nonproliferative diabetic retinopathy without macular edema, bilateral: Secondary | ICD-10-CM | POA: Diagnosis not present

## 2017-08-12 DIAGNOSIS — E109 Type 1 diabetes mellitus without complications: Secondary | ICD-10-CM | POA: Diagnosis not present

## 2017-09-08 DIAGNOSIS — J069 Acute upper respiratory infection, unspecified: Secondary | ICD-10-CM | POA: Diagnosis not present

## 2017-10-21 DIAGNOSIS — E109 Type 1 diabetes mellitus without complications: Secondary | ICD-10-CM | POA: Diagnosis not present

## 2017-11-06 DIAGNOSIS — Z1231 Encounter for screening mammogram for malignant neoplasm of breast: Secondary | ICD-10-CM | POA: Diagnosis not present

## 2017-11-10 DIAGNOSIS — E1040 Type 1 diabetes mellitus with diabetic neuropathy, unspecified: Secondary | ICD-10-CM | POA: Diagnosis not present

## 2017-11-10 DIAGNOSIS — E065 Other chronic thyroiditis: Secondary | ICD-10-CM | POA: Diagnosis not present

## 2017-11-10 DIAGNOSIS — R82998 Other abnormal findings in urine: Secondary | ICD-10-CM | POA: Diagnosis not present

## 2017-11-10 DIAGNOSIS — E7849 Other hyperlipidemia: Secondary | ICD-10-CM | POA: Diagnosis not present

## 2017-11-17 DIAGNOSIS — E065 Other chronic thyroiditis: Secondary | ICD-10-CM | POA: Diagnosis not present

## 2017-11-17 DIAGNOSIS — Z6831 Body mass index (BMI) 31.0-31.9, adult: Secondary | ICD-10-CM | POA: Diagnosis not present

## 2017-11-17 DIAGNOSIS — E7849 Other hyperlipidemia: Secondary | ICD-10-CM | POA: Diagnosis not present

## 2017-11-17 DIAGNOSIS — E1140 Type 2 diabetes mellitus with diabetic neuropathy, unspecified: Secondary | ICD-10-CM | POA: Diagnosis not present

## 2017-11-17 DIAGNOSIS — E113293 Type 2 diabetes mellitus with mild nonproliferative diabetic retinopathy without macular edema, bilateral: Secondary | ICD-10-CM | POA: Diagnosis not present

## 2017-11-17 DIAGNOSIS — Z1389 Encounter for screening for other disorder: Secondary | ICD-10-CM | POA: Diagnosis not present

## 2017-11-17 DIAGNOSIS — E041 Nontoxic single thyroid nodule: Secondary | ICD-10-CM | POA: Diagnosis not present

## 2017-11-17 DIAGNOSIS — Z0000 Encounter for general adult medical examination without abnormal findings: Secondary | ICD-10-CM | POA: Diagnosis not present

## 2017-11-17 DIAGNOSIS — Z794 Long term (current) use of insulin: Secondary | ICD-10-CM | POA: Diagnosis not present

## 2017-11-17 DIAGNOSIS — E048 Other specified nontoxic goiter: Secondary | ICD-10-CM | POA: Diagnosis not present

## 2017-11-17 DIAGNOSIS — E109 Type 1 diabetes mellitus without complications: Secondary | ICD-10-CM | POA: Diagnosis not present

## 2017-11-25 DIAGNOSIS — E041 Nontoxic single thyroid nodule: Secondary | ICD-10-CM | POA: Diagnosis not present

## 2017-11-25 DIAGNOSIS — E049 Nontoxic goiter, unspecified: Secondary | ICD-10-CM | POA: Diagnosis not present

## 2017-11-27 DIAGNOSIS — Z1212 Encounter for screening for malignant neoplasm of rectum: Secondary | ICD-10-CM | POA: Diagnosis not present

## 2018-01-15 DIAGNOSIS — H2513 Age-related nuclear cataract, bilateral: Secondary | ICD-10-CM | POA: Diagnosis not present

## 2018-01-15 DIAGNOSIS — E113293 Type 2 diabetes mellitus with mild nonproliferative diabetic retinopathy without macular edema, bilateral: Secondary | ICD-10-CM | POA: Diagnosis not present

## 2018-02-18 DIAGNOSIS — E109 Type 1 diabetes mellitus without complications: Secondary | ICD-10-CM | POA: Diagnosis not present

## 2018-03-30 DIAGNOSIS — E065 Other chronic thyroiditis: Secondary | ICD-10-CM | POA: Diagnosis not present

## 2018-03-30 DIAGNOSIS — Z23 Encounter for immunization: Secondary | ICD-10-CM | POA: Diagnosis not present

## 2018-03-30 DIAGNOSIS — E1140 Type 2 diabetes mellitus with diabetic neuropathy, unspecified: Secondary | ICD-10-CM | POA: Diagnosis not present

## 2018-03-30 DIAGNOSIS — E109 Type 1 diabetes mellitus without complications: Secondary | ICD-10-CM | POA: Diagnosis not present

## 2018-03-30 DIAGNOSIS — Z6831 Body mass index (BMI) 31.0-31.9, adult: Secondary | ICD-10-CM | POA: Diagnosis not present

## 2018-03-30 DIAGNOSIS — E041 Nontoxic single thyroid nodule: Secondary | ICD-10-CM | POA: Diagnosis not present

## 2018-05-04 DIAGNOSIS — Z01419 Encounter for gynecological examination (general) (routine) without abnormal findings: Secondary | ICD-10-CM | POA: Diagnosis not present

## 2018-05-05 DIAGNOSIS — L300 Nummular dermatitis: Secondary | ICD-10-CM | POA: Diagnosis not present

## 2018-05-05 DIAGNOSIS — L299 Pruritus, unspecified: Secondary | ICD-10-CM | POA: Diagnosis not present

## 2018-06-30 DIAGNOSIS — Z6831 Body mass index (BMI) 31.0-31.9, adult: Secondary | ICD-10-CM | POA: Diagnosis not present

## 2018-06-30 DIAGNOSIS — Z1389 Encounter for screening for other disorder: Secondary | ICD-10-CM | POA: Diagnosis not present

## 2018-06-30 DIAGNOSIS — M7989 Other specified soft tissue disorders: Secondary | ICD-10-CM | POA: Diagnosis not present

## 2018-06-30 DIAGNOSIS — E041 Nontoxic single thyroid nodule: Secondary | ICD-10-CM | POA: Diagnosis not present

## 2018-06-30 DIAGNOSIS — Z23 Encounter for immunization: Secondary | ICD-10-CM | POA: Diagnosis not present

## 2018-06-30 DIAGNOSIS — E049 Nontoxic goiter, unspecified: Secondary | ICD-10-CM | POA: Diagnosis not present

## 2018-06-30 DIAGNOSIS — Z794 Long term (current) use of insulin: Secondary | ICD-10-CM | POA: Diagnosis not present

## 2018-06-30 DIAGNOSIS — E109 Type 1 diabetes mellitus without complications: Secondary | ICD-10-CM | POA: Diagnosis not present

## 2018-07-29 DIAGNOSIS — E109 Type 1 diabetes mellitus without complications: Secondary | ICD-10-CM | POA: Diagnosis not present

## 2018-08-11 DIAGNOSIS — E119 Type 2 diabetes mellitus without complications: Secondary | ICD-10-CM | POA: Diagnosis not present

## 2018-09-24 DIAGNOSIS — Z6831 Body mass index (BMI) 31.0-31.9, adult: Secondary | ICD-10-CM | POA: Diagnosis not present

## 2018-09-24 DIAGNOSIS — E065 Other chronic thyroiditis: Secondary | ICD-10-CM | POA: Diagnosis not present

## 2018-09-24 DIAGNOSIS — Z1389 Encounter for screening for other disorder: Secondary | ICD-10-CM | POA: Diagnosis not present

## 2018-09-24 DIAGNOSIS — M7989 Other specified soft tissue disorders: Secondary | ICD-10-CM | POA: Diagnosis not present

## 2018-09-24 DIAGNOSIS — Z794 Long term (current) use of insulin: Secondary | ICD-10-CM | POA: Diagnosis not present

## 2018-09-24 DIAGNOSIS — E7849 Other hyperlipidemia: Secondary | ICD-10-CM | POA: Diagnosis not present

## 2018-09-24 DIAGNOSIS — R21 Rash and other nonspecific skin eruption: Secondary | ICD-10-CM | POA: Diagnosis not present

## 2018-09-24 DIAGNOSIS — M659 Synovitis and tenosynovitis, unspecified: Secondary | ICD-10-CM | POA: Diagnosis not present

## 2018-09-24 DIAGNOSIS — E109 Type 1 diabetes mellitus without complications: Secondary | ICD-10-CM | POA: Diagnosis not present

## 2018-11-18 DIAGNOSIS — Z1231 Encounter for screening mammogram for malignant neoplasm of breast: Secondary | ICD-10-CM | POA: Diagnosis not present

## 2018-11-25 DIAGNOSIS — M2550 Pain in unspecified joint: Secondary | ICD-10-CM | POA: Diagnosis not present

## 2018-11-25 DIAGNOSIS — R768 Other specified abnormal immunological findings in serum: Secondary | ICD-10-CM | POA: Diagnosis not present

## 2018-11-25 DIAGNOSIS — Z6831 Body mass index (BMI) 31.0-31.9, adult: Secondary | ICD-10-CM | POA: Diagnosis not present

## 2018-11-25 DIAGNOSIS — E669 Obesity, unspecified: Secondary | ICD-10-CM | POA: Diagnosis not present

## 2018-11-30 DIAGNOSIS — N882 Stricture and stenosis of cervix uteri: Secondary | ICD-10-CM | POA: Diagnosis not present

## 2018-11-30 DIAGNOSIS — N950 Postmenopausal bleeding: Secondary | ICD-10-CM | POA: Diagnosis not present

## 2018-12-02 DIAGNOSIS — N950 Postmenopausal bleeding: Secondary | ICD-10-CM | POA: Diagnosis not present

## 2018-12-07 DIAGNOSIS — R768 Other specified abnormal immunological findings in serum: Secondary | ICD-10-CM | POA: Diagnosis not present

## 2018-12-07 DIAGNOSIS — M2550 Pain in unspecified joint: Secondary | ICD-10-CM | POA: Diagnosis not present

## 2018-12-07 DIAGNOSIS — E669 Obesity, unspecified: Secondary | ICD-10-CM | POA: Diagnosis not present

## 2018-12-07 DIAGNOSIS — Z6831 Body mass index (BMI) 31.0-31.9, adult: Secondary | ICD-10-CM | POA: Diagnosis not present

## 2018-12-11 DIAGNOSIS — E109 Type 1 diabetes mellitus without complications: Secondary | ICD-10-CM | POA: Diagnosis not present

## 2018-12-11 DIAGNOSIS — E7849 Other hyperlipidemia: Secondary | ICD-10-CM | POA: Diagnosis not present

## 2018-12-11 DIAGNOSIS — E041 Nontoxic single thyroid nodule: Secondary | ICD-10-CM | POA: Diagnosis not present

## 2018-12-11 DIAGNOSIS — R82998 Other abnormal findings in urine: Secondary | ICD-10-CM | POA: Diagnosis not present

## 2018-12-16 DIAGNOSIS — E109 Type 1 diabetes mellitus without complications: Secondary | ICD-10-CM | POA: Diagnosis not present

## 2018-12-18 DIAGNOSIS — D899 Disorder involving the immune mechanism, unspecified: Secondary | ICD-10-CM | POA: Diagnosis not present

## 2018-12-18 DIAGNOSIS — E041 Nontoxic single thyroid nodule: Secondary | ICD-10-CM | POA: Diagnosis not present

## 2018-12-18 DIAGNOSIS — Z6830 Body mass index (BMI) 30.0-30.9, adult: Secondary | ICD-10-CM | POA: Diagnosis not present

## 2018-12-18 DIAGNOSIS — R760 Raised antibody titer: Secondary | ICD-10-CM | POA: Diagnosis not present

## 2018-12-18 DIAGNOSIS — Z794 Long term (current) use of insulin: Secondary | ICD-10-CM | POA: Diagnosis not present

## 2018-12-18 DIAGNOSIS — M659 Synovitis and tenosynovitis, unspecified: Secondary | ICD-10-CM | POA: Diagnosis not present

## 2018-12-18 DIAGNOSIS — E109 Type 1 diabetes mellitus without complications: Secondary | ICD-10-CM | POA: Diagnosis not present

## 2018-12-18 DIAGNOSIS — E7849 Other hyperlipidemia: Secondary | ICD-10-CM | POA: Diagnosis not present

## 2018-12-18 DIAGNOSIS — N950 Postmenopausal bleeding: Secondary | ICD-10-CM | POA: Diagnosis not present

## 2018-12-18 DIAGNOSIS — Z1331 Encounter for screening for depression: Secondary | ICD-10-CM | POA: Diagnosis not present

## 2018-12-18 DIAGNOSIS — Z0000 Encounter for general adult medical examination without abnormal findings: Secondary | ICD-10-CM | POA: Diagnosis not present

## 2019-01-25 DIAGNOSIS — Z1211 Encounter for screening for malignant neoplasm of colon: Secondary | ICD-10-CM | POA: Diagnosis not present

## 2019-01-25 DIAGNOSIS — Z1212 Encounter for screening for malignant neoplasm of rectum: Secondary | ICD-10-CM | POA: Diagnosis not present

## 2019-03-10 DIAGNOSIS — M7989 Other specified soft tissue disorders: Secondary | ICD-10-CM | POA: Diagnosis not present

## 2019-03-10 DIAGNOSIS — R768 Other specified abnormal immunological findings in serum: Secondary | ICD-10-CM | POA: Diagnosis not present

## 2019-03-10 DIAGNOSIS — M2550 Pain in unspecified joint: Secondary | ICD-10-CM | POA: Diagnosis not present

## 2019-04-06 DIAGNOSIS — E109 Type 1 diabetes mellitus without complications: Secondary | ICD-10-CM | POA: Diagnosis not present

## 2019-04-26 DIAGNOSIS — Z862 Personal history of diseases of the blood and blood-forming organs and certain disorders involving the immune mechanism: Secondary | ICD-10-CM | POA: Diagnosis not present

## 2019-04-26 DIAGNOSIS — N950 Postmenopausal bleeding: Secondary | ICD-10-CM | POA: Diagnosis not present

## 2019-04-26 DIAGNOSIS — M659 Synovitis and tenosynovitis, unspecified: Secondary | ICD-10-CM | POA: Diagnosis not present

## 2019-04-26 DIAGNOSIS — E109 Type 1 diabetes mellitus without complications: Secondary | ICD-10-CM | POA: Diagnosis not present

## 2019-05-19 DIAGNOSIS — E109 Type 1 diabetes mellitus without complications: Secondary | ICD-10-CM | POA: Diagnosis not present

## 2019-05-19 DIAGNOSIS — Z794 Long term (current) use of insulin: Secondary | ICD-10-CM | POA: Diagnosis not present

## 2019-05-19 DIAGNOSIS — Z4681 Encounter for fitting and adjustment of insulin pump: Secondary | ICD-10-CM | POA: Diagnosis not present

## 2019-06-02 DIAGNOSIS — Z01419 Encounter for gynecological examination (general) (routine) without abnormal findings: Secondary | ICD-10-CM | POA: Diagnosis not present

## 2019-07-08 DIAGNOSIS — R768 Other specified abnormal immunological findings in serum: Secondary | ICD-10-CM | POA: Diagnosis not present

## 2019-07-08 DIAGNOSIS — M2550 Pain in unspecified joint: Secondary | ICD-10-CM | POA: Diagnosis not present

## 2019-07-28 DIAGNOSIS — E109 Type 1 diabetes mellitus without complications: Secondary | ICD-10-CM | POA: Diagnosis not present

## 2019-08-09 DIAGNOSIS — Z4681 Encounter for fitting and adjustment of insulin pump: Secondary | ICD-10-CM | POA: Diagnosis not present

## 2019-08-09 DIAGNOSIS — E109 Type 1 diabetes mellitus without complications: Secondary | ICD-10-CM | POA: Diagnosis not present

## 2019-08-09 DIAGNOSIS — Z794 Long term (current) use of insulin: Secondary | ICD-10-CM | POA: Diagnosis not present

## 2019-08-09 DIAGNOSIS — Z23 Encounter for immunization: Secondary | ICD-10-CM | POA: Diagnosis not present

## 2019-08-16 DIAGNOSIS — E109 Type 1 diabetes mellitus without complications: Secondary | ICD-10-CM | POA: Diagnosis not present

## 2019-08-16 DIAGNOSIS — Z794 Long term (current) use of insulin: Secondary | ICD-10-CM | POA: Diagnosis not present

## 2019-08-16 DIAGNOSIS — R760 Raised antibody titer: Secondary | ICD-10-CM | POA: Diagnosis not present

## 2019-08-16 DIAGNOSIS — N950 Postmenopausal bleeding: Secondary | ICD-10-CM | POA: Diagnosis not present

## 2019-08-28 DIAGNOSIS — E109 Type 1 diabetes mellitus without complications: Secondary | ICD-10-CM | POA: Diagnosis not present

## 2019-09-10 DIAGNOSIS — E1065 Type 1 diabetes mellitus with hyperglycemia: Secondary | ICD-10-CM | POA: Diagnosis not present

## 2019-09-10 DIAGNOSIS — E109 Type 1 diabetes mellitus without complications: Secondary | ICD-10-CM | POA: Diagnosis not present

## 2019-09-27 DIAGNOSIS — E109 Type 1 diabetes mellitus without complications: Secondary | ICD-10-CM | POA: Diagnosis not present

## 2019-10-11 DIAGNOSIS — E1065 Type 1 diabetes mellitus with hyperglycemia: Secondary | ICD-10-CM | POA: Diagnosis not present

## 2019-10-11 DIAGNOSIS — E109 Type 1 diabetes mellitus without complications: Secondary | ICD-10-CM | POA: Diagnosis not present

## 2019-10-28 DIAGNOSIS — E109 Type 1 diabetes mellitus without complications: Secondary | ICD-10-CM | POA: Diagnosis not present

## 2019-11-01 DIAGNOSIS — E1065 Type 1 diabetes mellitus with hyperglycemia: Secondary | ICD-10-CM | POA: Diagnosis not present

## 2019-11-01 DIAGNOSIS — E109 Type 1 diabetes mellitus without complications: Secondary | ICD-10-CM | POA: Diagnosis not present

## 2019-11-08 DIAGNOSIS — Z9089 Acquired absence of other organs: Secondary | ICD-10-CM | POA: Diagnosis not present

## 2019-11-08 DIAGNOSIS — J3801 Paralysis of vocal cords and larynx, unilateral: Secondary | ICD-10-CM | POA: Diagnosis not present

## 2019-11-08 DIAGNOSIS — K219 Gastro-esophageal reflux disease without esophagitis: Secondary | ICD-10-CM | POA: Insufficient documentation

## 2019-11-08 HISTORY — DX: Gastro-esophageal reflux disease without esophagitis: K21.9

## 2019-11-11 DIAGNOSIS — E109 Type 1 diabetes mellitus without complications: Secondary | ICD-10-CM | POA: Diagnosis not present

## 2019-11-11 DIAGNOSIS — E1065 Type 1 diabetes mellitus with hyperglycemia: Secondary | ICD-10-CM | POA: Diagnosis not present

## 2019-11-28 DIAGNOSIS — E109 Type 1 diabetes mellitus without complications: Secondary | ICD-10-CM | POA: Diagnosis not present

## 2019-12-06 DIAGNOSIS — E119 Type 2 diabetes mellitus without complications: Secondary | ICD-10-CM | POA: Diagnosis not present

## 2019-12-07 DIAGNOSIS — M898X1 Other specified disorders of bone, shoulder: Secondary | ICD-10-CM | POA: Diagnosis not present

## 2019-12-07 DIAGNOSIS — M25511 Pain in right shoulder: Secondary | ICD-10-CM | POA: Diagnosis not present

## 2019-12-08 DIAGNOSIS — M25611 Stiffness of right shoulder, not elsewhere classified: Secondary | ICD-10-CM | POA: Diagnosis not present

## 2019-12-08 DIAGNOSIS — M25511 Pain in right shoulder: Secondary | ICD-10-CM | POA: Diagnosis not present

## 2019-12-10 DIAGNOSIS — M25611 Stiffness of right shoulder, not elsewhere classified: Secondary | ICD-10-CM | POA: Diagnosis not present

## 2019-12-10 DIAGNOSIS — M25511 Pain in right shoulder: Secondary | ICD-10-CM | POA: Diagnosis not present

## 2019-12-13 DIAGNOSIS — E7849 Other hyperlipidemia: Secondary | ICD-10-CM | POA: Diagnosis not present

## 2019-12-13 DIAGNOSIS — M25511 Pain in right shoulder: Secondary | ICD-10-CM | POA: Diagnosis not present

## 2019-12-13 DIAGNOSIS — M25611 Stiffness of right shoulder, not elsewhere classified: Secondary | ICD-10-CM | POA: Diagnosis not present

## 2019-12-13 DIAGNOSIS — E109 Type 1 diabetes mellitus without complications: Secondary | ICD-10-CM | POA: Diagnosis not present

## 2019-12-13 DIAGNOSIS — E1065 Type 1 diabetes mellitus with hyperglycemia: Secondary | ICD-10-CM | POA: Diagnosis not present

## 2019-12-13 DIAGNOSIS — E049 Nontoxic goiter, unspecified: Secondary | ICD-10-CM | POA: Diagnosis not present

## 2019-12-17 DIAGNOSIS — M25611 Stiffness of right shoulder, not elsewhere classified: Secondary | ICD-10-CM | POA: Diagnosis not present

## 2019-12-17 DIAGNOSIS — M25511 Pain in right shoulder: Secondary | ICD-10-CM | POA: Diagnosis not present

## 2019-12-20 DIAGNOSIS — J040 Acute laryngitis: Secondary | ICD-10-CM | POA: Diagnosis not present

## 2019-12-20 DIAGNOSIS — Z862 Personal history of diseases of the blood and blood-forming organs and certain disorders involving the immune mechanism: Secondary | ICD-10-CM | POA: Diagnosis not present

## 2019-12-20 DIAGNOSIS — M659 Synovitis and tenosynovitis, unspecified: Secondary | ICD-10-CM | POA: Diagnosis not present

## 2019-12-20 DIAGNOSIS — E1040 Type 1 diabetes mellitus with diabetic neuropathy, unspecified: Secondary | ICD-10-CM | POA: Diagnosis not present

## 2019-12-20 DIAGNOSIS — Z0000 Encounter for general adult medical examination without abnormal findings: Secondary | ICD-10-CM | POA: Diagnosis not present

## 2019-12-20 DIAGNOSIS — R82998 Other abnormal findings in urine: Secondary | ICD-10-CM | POA: Diagnosis not present

## 2019-12-20 DIAGNOSIS — E7849 Other hyperlipidemia: Secondary | ICD-10-CM | POA: Diagnosis not present

## 2019-12-20 DIAGNOSIS — E041 Nontoxic single thyroid nodule: Secondary | ICD-10-CM | POA: Diagnosis not present

## 2019-12-20 DIAGNOSIS — E065 Other chronic thyroiditis: Secondary | ICD-10-CM | POA: Diagnosis not present

## 2019-12-20 DIAGNOSIS — M25519 Pain in unspecified shoulder: Secondary | ICD-10-CM | POA: Diagnosis not present

## 2019-12-20 DIAGNOSIS — Z794 Long term (current) use of insulin: Secondary | ICD-10-CM | POA: Diagnosis not present

## 2019-12-20 DIAGNOSIS — M7989 Other specified soft tissue disorders: Secondary | ICD-10-CM | POA: Diagnosis not present

## 2019-12-20 DIAGNOSIS — N950 Postmenopausal bleeding: Secondary | ICD-10-CM | POA: Diagnosis not present

## 2019-12-21 DIAGNOSIS — M25611 Stiffness of right shoulder, not elsewhere classified: Secondary | ICD-10-CM | POA: Diagnosis not present

## 2019-12-21 DIAGNOSIS — M25511 Pain in right shoulder: Secondary | ICD-10-CM | POA: Diagnosis not present

## 2019-12-22 DIAGNOSIS — Z1231 Encounter for screening mammogram for malignant neoplasm of breast: Secondary | ICD-10-CM | POA: Diagnosis not present

## 2019-12-23 DIAGNOSIS — M25611 Stiffness of right shoulder, not elsewhere classified: Secondary | ICD-10-CM | POA: Diagnosis not present

## 2019-12-23 DIAGNOSIS — M25511 Pain in right shoulder: Secondary | ICD-10-CM | POA: Diagnosis not present

## 2019-12-26 DIAGNOSIS — E109 Type 1 diabetes mellitus without complications: Secondary | ICD-10-CM | POA: Diagnosis not present

## 2019-12-27 DIAGNOSIS — M25611 Stiffness of right shoulder, not elsewhere classified: Secondary | ICD-10-CM | POA: Diagnosis not present

## 2019-12-27 DIAGNOSIS — M25511 Pain in right shoulder: Secondary | ICD-10-CM | POA: Diagnosis not present

## 2019-12-29 DIAGNOSIS — M25611 Stiffness of right shoulder, not elsewhere classified: Secondary | ICD-10-CM | POA: Diagnosis not present

## 2019-12-29 DIAGNOSIS — M25511 Pain in right shoulder: Secondary | ICD-10-CM | POA: Diagnosis not present

## 2020-01-03 DIAGNOSIS — M25511 Pain in right shoulder: Secondary | ICD-10-CM | POA: Diagnosis not present

## 2020-01-03 DIAGNOSIS — M25611 Stiffness of right shoulder, not elsewhere classified: Secondary | ICD-10-CM | POA: Diagnosis not present

## 2020-01-05 DIAGNOSIS — M25511 Pain in right shoulder: Secondary | ICD-10-CM | POA: Diagnosis not present

## 2020-01-05 DIAGNOSIS — M25611 Stiffness of right shoulder, not elsewhere classified: Secondary | ICD-10-CM | POA: Diagnosis not present

## 2020-01-10 DIAGNOSIS — M25611 Stiffness of right shoulder, not elsewhere classified: Secondary | ICD-10-CM | POA: Diagnosis not present

## 2020-01-10 DIAGNOSIS — M25511 Pain in right shoulder: Secondary | ICD-10-CM | POA: Diagnosis not present

## 2020-01-12 DIAGNOSIS — M25611 Stiffness of right shoulder, not elsewhere classified: Secondary | ICD-10-CM | POA: Diagnosis not present

## 2020-01-12 DIAGNOSIS — M25511 Pain in right shoulder: Secondary | ICD-10-CM | POA: Diagnosis not present

## 2020-01-13 DIAGNOSIS — E109 Type 1 diabetes mellitus without complications: Secondary | ICD-10-CM | POA: Diagnosis not present

## 2020-01-13 DIAGNOSIS — E1065 Type 1 diabetes mellitus with hyperglycemia: Secondary | ICD-10-CM | POA: Diagnosis not present

## 2020-01-17 DIAGNOSIS — M25511 Pain in right shoulder: Secondary | ICD-10-CM | POA: Diagnosis not present

## 2020-01-17 DIAGNOSIS — M25611 Stiffness of right shoulder, not elsewhere classified: Secondary | ICD-10-CM | POA: Diagnosis not present

## 2020-01-19 DIAGNOSIS — M25611 Stiffness of right shoulder, not elsewhere classified: Secondary | ICD-10-CM | POA: Diagnosis not present

## 2020-01-19 DIAGNOSIS — M25511 Pain in right shoulder: Secondary | ICD-10-CM | POA: Diagnosis not present

## 2020-01-24 DIAGNOSIS — M25611 Stiffness of right shoulder, not elsewhere classified: Secondary | ICD-10-CM | POA: Diagnosis not present

## 2020-01-24 DIAGNOSIS — M25511 Pain in right shoulder: Secondary | ICD-10-CM | POA: Diagnosis not present

## 2020-01-26 DIAGNOSIS — E109 Type 1 diabetes mellitus without complications: Secondary | ICD-10-CM | POA: Diagnosis not present

## 2020-01-28 DIAGNOSIS — M25511 Pain in right shoulder: Secondary | ICD-10-CM | POA: Diagnosis not present

## 2020-01-28 DIAGNOSIS — M25611 Stiffness of right shoulder, not elsewhere classified: Secondary | ICD-10-CM | POA: Diagnosis not present

## 2020-01-31 DIAGNOSIS — M25511 Pain in right shoulder: Secondary | ICD-10-CM | POA: Diagnosis not present

## 2020-01-31 DIAGNOSIS — M25611 Stiffness of right shoulder, not elsewhere classified: Secondary | ICD-10-CM | POA: Diagnosis not present

## 2020-02-02 DIAGNOSIS — M25611 Stiffness of right shoulder, not elsewhere classified: Secondary | ICD-10-CM | POA: Diagnosis not present

## 2020-02-02 DIAGNOSIS — E109 Type 1 diabetes mellitus without complications: Secondary | ICD-10-CM | POA: Diagnosis not present

## 2020-02-02 DIAGNOSIS — E1065 Type 1 diabetes mellitus with hyperglycemia: Secondary | ICD-10-CM | POA: Diagnosis not present

## 2020-02-02 DIAGNOSIS — M25511 Pain in right shoulder: Secondary | ICD-10-CM | POA: Diagnosis not present

## 2020-02-03 DIAGNOSIS — M25511 Pain in right shoulder: Secondary | ICD-10-CM | POA: Diagnosis not present

## 2020-02-07 DIAGNOSIS — J3801 Paralysis of vocal cords and larynx, unilateral: Secondary | ICD-10-CM | POA: Diagnosis not present

## 2020-02-07 DIAGNOSIS — K219 Gastro-esophageal reflux disease without esophagitis: Secondary | ICD-10-CM | POA: Diagnosis not present

## 2020-02-15 DIAGNOSIS — E1065 Type 1 diabetes mellitus with hyperglycemia: Secondary | ICD-10-CM | POA: Diagnosis not present

## 2020-02-15 DIAGNOSIS — E109 Type 1 diabetes mellitus without complications: Secondary | ICD-10-CM | POA: Diagnosis not present

## 2020-02-25 DIAGNOSIS — E109 Type 1 diabetes mellitus without complications: Secondary | ICD-10-CM | POA: Diagnosis not present

## 2020-03-13 DIAGNOSIS — J384 Edema of larynx: Secondary | ICD-10-CM | POA: Diagnosis not present

## 2020-03-13 DIAGNOSIS — R490 Dysphonia: Secondary | ICD-10-CM | POA: Diagnosis not present

## 2020-03-13 DIAGNOSIS — J387 Other diseases of larynx: Secondary | ICD-10-CM | POA: Diagnosis not present

## 2020-03-13 DIAGNOSIS — J3801 Paralysis of vocal cords and larynx, unilateral: Secondary | ICD-10-CM | POA: Diagnosis not present

## 2020-03-13 DIAGNOSIS — J383 Other diseases of vocal cords: Secondary | ICD-10-CM | POA: Diagnosis not present

## 2020-03-17 DIAGNOSIS — E1065 Type 1 diabetes mellitus with hyperglycemia: Secondary | ICD-10-CM | POA: Diagnosis not present

## 2020-03-17 DIAGNOSIS — E109 Type 1 diabetes mellitus without complications: Secondary | ICD-10-CM | POA: Diagnosis not present

## 2020-03-27 DIAGNOSIS — E109 Type 1 diabetes mellitus without complications: Secondary | ICD-10-CM | POA: Diagnosis not present

## 2020-03-30 DIAGNOSIS — E041 Nontoxic single thyroid nodule: Secondary | ICD-10-CM | POA: Diagnosis not present

## 2020-04-12 DIAGNOSIS — E042 Nontoxic multinodular goiter: Secondary | ICD-10-CM | POA: Diagnosis not present

## 2020-04-17 DIAGNOSIS — E109 Type 1 diabetes mellitus without complications: Secondary | ICD-10-CM | POA: Diagnosis not present

## 2020-04-17 DIAGNOSIS — E1065 Type 1 diabetes mellitus with hyperglycemia: Secondary | ICD-10-CM | POA: Diagnosis not present

## 2020-04-26 DIAGNOSIS — E109 Type 1 diabetes mellitus without complications: Secondary | ICD-10-CM | POA: Diagnosis not present

## 2020-04-28 DIAGNOSIS — R760 Raised antibody titer: Secondary | ICD-10-CM | POA: Diagnosis not present

## 2020-04-28 DIAGNOSIS — Z794 Long term (current) use of insulin: Secondary | ICD-10-CM | POA: Diagnosis not present

## 2020-04-28 DIAGNOSIS — M25519 Pain in unspecified shoulder: Secondary | ICD-10-CM | POA: Diagnosis not present

## 2020-04-28 DIAGNOSIS — M659 Synovitis and tenosynovitis, unspecified: Secondary | ICD-10-CM | POA: Diagnosis not present

## 2020-04-28 DIAGNOSIS — E041 Nontoxic single thyroid nodule: Secondary | ICD-10-CM | POA: Diagnosis not present

## 2020-04-28 DIAGNOSIS — J040 Acute laryngitis: Secondary | ICD-10-CM | POA: Diagnosis not present

## 2020-04-28 DIAGNOSIS — E109 Type 1 diabetes mellitus without complications: Secondary | ICD-10-CM | POA: Diagnosis not present

## 2020-04-28 DIAGNOSIS — Z862 Personal history of diseases of the blood and blood-forming organs and certain disorders involving the immune mechanism: Secondary | ICD-10-CM | POA: Diagnosis not present

## 2020-05-03 DIAGNOSIS — C73 Malignant neoplasm of thyroid gland: Secondary | ICD-10-CM | POA: Diagnosis not present

## 2020-05-03 DIAGNOSIS — E1065 Type 1 diabetes mellitus with hyperglycemia: Secondary | ICD-10-CM | POA: Diagnosis not present

## 2020-05-03 DIAGNOSIS — E109 Type 1 diabetes mellitus without complications: Secondary | ICD-10-CM | POA: Diagnosis not present

## 2020-05-03 DIAGNOSIS — E041 Nontoxic single thyroid nodule: Secondary | ICD-10-CM | POA: Diagnosis not present

## 2020-05-18 DIAGNOSIS — R911 Solitary pulmonary nodule: Secondary | ICD-10-CM | POA: Diagnosis not present

## 2020-05-18 DIAGNOSIS — C73 Malignant neoplasm of thyroid gland: Secondary | ICD-10-CM | POA: Insufficient documentation

## 2020-05-18 DIAGNOSIS — Z01818 Encounter for other preprocedural examination: Secondary | ICD-10-CM | POA: Diagnosis not present

## 2020-05-18 DIAGNOSIS — E041 Nontoxic single thyroid nodule: Secondary | ICD-10-CM | POA: Diagnosis not present

## 2020-05-18 DIAGNOSIS — E109 Type 1 diabetes mellitus without complications: Secondary | ICD-10-CM | POA: Diagnosis not present

## 2020-05-18 DIAGNOSIS — E1065 Type 1 diabetes mellitus with hyperglycemia: Secondary | ICD-10-CM | POA: Diagnosis not present

## 2020-05-18 DIAGNOSIS — J3800 Paralysis of vocal cords and larynx, unspecified: Secondary | ICD-10-CM | POA: Diagnosis not present

## 2020-05-18 HISTORY — DX: Malignant neoplasm of thyroid gland: C73

## 2020-05-25 DIAGNOSIS — R911 Solitary pulmonary nodule: Secondary | ICD-10-CM | POA: Diagnosis not present

## 2020-06-05 DIAGNOSIS — C770 Secondary and unspecified malignant neoplasm of lymph nodes of head, face and neck: Secondary | ICD-10-CM | POA: Diagnosis not present

## 2020-06-05 DIAGNOSIS — Z8249 Family history of ischemic heart disease and other diseases of the circulatory system: Secondary | ICD-10-CM | POA: Diagnosis not present

## 2020-06-05 DIAGNOSIS — J3801 Paralysis of vocal cords and larynx, unilateral: Secondary | ICD-10-CM | POA: Diagnosis not present

## 2020-06-05 DIAGNOSIS — R9431 Abnormal electrocardiogram [ECG] [EKG]: Secondary | ICD-10-CM | POA: Diagnosis not present

## 2020-06-05 DIAGNOSIS — Z9641 Presence of insulin pump (external) (internal): Secondary | ICD-10-CM | POA: Diagnosis not present

## 2020-06-05 DIAGNOSIS — Z7982 Long term (current) use of aspirin: Secondary | ICD-10-CM | POA: Diagnosis not present

## 2020-06-05 DIAGNOSIS — G8929 Other chronic pain: Secondary | ICD-10-CM | POA: Diagnosis not present

## 2020-06-05 DIAGNOSIS — E109 Type 1 diabetes mellitus without complications: Secondary | ICD-10-CM | POA: Diagnosis not present

## 2020-06-05 DIAGNOSIS — E785 Hyperlipidemia, unspecified: Secondary | ICD-10-CM | POA: Diagnosis not present

## 2020-06-05 DIAGNOSIS — C73 Malignant neoplasm of thyroid gland: Secondary | ICD-10-CM | POA: Diagnosis not present

## 2020-06-05 DIAGNOSIS — K219 Gastro-esophageal reflux disease without esophagitis: Secondary | ICD-10-CM | POA: Diagnosis not present

## 2020-06-05 DIAGNOSIS — Z79899 Other long term (current) drug therapy: Secondary | ICD-10-CM | POA: Diagnosis not present

## 2020-06-07 DIAGNOSIS — Z01419 Encounter for gynecological examination (general) (routine) without abnormal findings: Secondary | ICD-10-CM | POA: Diagnosis not present

## 2020-06-07 HISTORY — PX: THYROIDECTOMY: SHX17

## 2020-06-09 DIAGNOSIS — Z7982 Long term (current) use of aspirin: Secondary | ICD-10-CM | POA: Diagnosis not present

## 2020-06-09 DIAGNOSIS — K219 Gastro-esophageal reflux disease without esophagitis: Secondary | ICD-10-CM | POA: Diagnosis not present

## 2020-06-09 DIAGNOSIS — E063 Autoimmune thyroiditis: Secondary | ICD-10-CM | POA: Diagnosis not present

## 2020-06-09 DIAGNOSIS — E042 Nontoxic multinodular goiter: Secondary | ICD-10-CM | POA: Diagnosis not present

## 2020-06-09 DIAGNOSIS — C73 Malignant neoplasm of thyroid gland: Secondary | ICD-10-CM | POA: Diagnosis not present

## 2020-06-09 DIAGNOSIS — J3801 Paralysis of vocal cords and larynx, unilateral: Secondary | ICD-10-CM | POA: Diagnosis not present

## 2020-06-09 DIAGNOSIS — E785 Hyperlipidemia, unspecified: Secondary | ICD-10-CM | POA: Diagnosis not present

## 2020-06-09 DIAGNOSIS — Z8249 Family history of ischemic heart disease and other diseases of the circulatory system: Secondary | ICD-10-CM | POA: Diagnosis not present

## 2020-06-09 DIAGNOSIS — Z9641 Presence of insulin pump (external) (internal): Secondary | ICD-10-CM | POA: Diagnosis not present

## 2020-06-09 DIAGNOSIS — Z79899 Other long term (current) drug therapy: Secondary | ICD-10-CM | POA: Diagnosis not present

## 2020-06-09 DIAGNOSIS — C770 Secondary and unspecified malignant neoplasm of lymph nodes of head, face and neck: Secondary | ICD-10-CM | POA: Diagnosis not present

## 2020-06-09 DIAGNOSIS — E109 Type 1 diabetes mellitus without complications: Secondary | ICD-10-CM | POA: Diagnosis not present

## 2020-06-09 DIAGNOSIS — G8929 Other chronic pain: Secondary | ICD-10-CM | POA: Diagnosis not present

## 2020-06-15 DIAGNOSIS — J3801 Paralysis of vocal cords and larynx, unilateral: Secondary | ICD-10-CM | POA: Diagnosis not present

## 2020-06-15 DIAGNOSIS — C73 Malignant neoplasm of thyroid gland: Secondary | ICD-10-CM | POA: Diagnosis not present

## 2020-06-19 DIAGNOSIS — C73 Malignant neoplasm of thyroid gland: Secondary | ICD-10-CM | POA: Diagnosis not present

## 2020-06-19 DIAGNOSIS — E1065 Type 1 diabetes mellitus with hyperglycemia: Secondary | ICD-10-CM | POA: Diagnosis not present

## 2020-06-19 DIAGNOSIS — J3801 Paralysis of vocal cords and larynx, unilateral: Secondary | ICD-10-CM | POA: Diagnosis not present

## 2020-06-19 DIAGNOSIS — E109 Type 1 diabetes mellitus without complications: Secondary | ICD-10-CM | POA: Diagnosis not present

## 2020-06-19 DIAGNOSIS — J384 Edema of larynx: Secondary | ICD-10-CM | POA: Diagnosis not present

## 2020-06-19 DIAGNOSIS — J383 Other diseases of vocal cords: Secondary | ICD-10-CM | POA: Diagnosis not present

## 2020-06-19 DIAGNOSIS — R490 Dysphonia: Secondary | ICD-10-CM | POA: Diagnosis not present

## 2020-07-13 DIAGNOSIS — E890 Postprocedural hypothyroidism: Secondary | ICD-10-CM | POA: Diagnosis not present

## 2020-07-13 DIAGNOSIS — C73 Malignant neoplasm of thyroid gland: Secondary | ICD-10-CM | POA: Diagnosis not present

## 2020-07-17 DIAGNOSIS — C73 Malignant neoplasm of thyroid gland: Secondary | ICD-10-CM | POA: Diagnosis not present

## 2020-07-19 DIAGNOSIS — E1065 Type 1 diabetes mellitus with hyperglycemia: Secondary | ICD-10-CM | POA: Diagnosis not present

## 2020-07-19 DIAGNOSIS — E109 Type 1 diabetes mellitus without complications: Secondary | ICD-10-CM | POA: Diagnosis not present

## 2020-07-20 DIAGNOSIS — C73 Malignant neoplasm of thyroid gland: Secondary | ICD-10-CM | POA: Diagnosis not present

## 2020-08-02 DIAGNOSIS — C73 Malignant neoplasm of thyroid gland: Secondary | ICD-10-CM | POA: Diagnosis not present

## 2020-08-03 DIAGNOSIS — C73 Malignant neoplasm of thyroid gland: Secondary | ICD-10-CM | POA: Diagnosis not present

## 2020-08-04 DIAGNOSIS — C73 Malignant neoplasm of thyroid gland: Secondary | ICD-10-CM | POA: Diagnosis not present

## 2020-08-11 DIAGNOSIS — C73 Malignant neoplasm of thyroid gland: Secondary | ICD-10-CM | POA: Diagnosis not present

## 2020-08-18 DIAGNOSIS — Z23 Encounter for immunization: Secondary | ICD-10-CM | POA: Diagnosis not present

## 2020-08-18 DIAGNOSIS — E1065 Type 1 diabetes mellitus with hyperglycemia: Secondary | ICD-10-CM | POA: Diagnosis not present

## 2020-08-18 DIAGNOSIS — C73 Malignant neoplasm of thyroid gland: Secondary | ICD-10-CM | POA: Diagnosis not present

## 2020-08-18 DIAGNOSIS — Z794 Long term (current) use of insulin: Secondary | ICD-10-CM | POA: Diagnosis not present

## 2020-08-18 DIAGNOSIS — R490 Dysphonia: Secondary | ICD-10-CM | POA: Diagnosis not present

## 2020-08-18 DIAGNOSIS — E039 Hypothyroidism, unspecified: Secondary | ICD-10-CM | POA: Diagnosis not present

## 2020-08-18 DIAGNOSIS — E109 Type 1 diabetes mellitus without complications: Secondary | ICD-10-CM | POA: Diagnosis not present

## 2020-09-07 DIAGNOSIS — E109 Type 1 diabetes mellitus without complications: Secondary | ICD-10-CM | POA: Diagnosis not present

## 2020-09-07 DIAGNOSIS — E890 Postprocedural hypothyroidism: Secondary | ICD-10-CM | POA: Diagnosis not present

## 2020-09-07 DIAGNOSIS — Z3202 Encounter for pregnancy test, result negative: Secondary | ICD-10-CM | POA: Diagnosis not present

## 2020-09-07 DIAGNOSIS — J3801 Paralysis of vocal cords and larynx, unilateral: Secondary | ICD-10-CM | POA: Diagnosis not present

## 2020-09-18 DIAGNOSIS — E109 Type 1 diabetes mellitus without complications: Secondary | ICD-10-CM | POA: Diagnosis not present

## 2020-09-18 DIAGNOSIS — E1065 Type 1 diabetes mellitus with hyperglycemia: Secondary | ICD-10-CM | POA: Diagnosis not present

## 2020-09-20 DIAGNOSIS — R911 Solitary pulmonary nodule: Secondary | ICD-10-CM | POA: Diagnosis not present

## 2020-09-20 DIAGNOSIS — R490 Dysphonia: Secondary | ICD-10-CM | POA: Diagnosis not present

## 2020-09-20 DIAGNOSIS — C73 Malignant neoplasm of thyroid gland: Secondary | ICD-10-CM | POA: Diagnosis not present

## 2020-09-20 DIAGNOSIS — Z85850 Personal history of malignant neoplasm of thyroid: Secondary | ICD-10-CM | POA: Diagnosis not present

## 2020-09-20 DIAGNOSIS — Z923 Personal history of irradiation: Secondary | ICD-10-CM | POA: Diagnosis not present

## 2020-09-27 DIAGNOSIS — H00022 Hordeolum internum right lower eyelid: Secondary | ICD-10-CM | POA: Diagnosis not present

## 2020-10-16 DIAGNOSIS — R1310 Dysphagia, unspecified: Secondary | ICD-10-CM | POA: Diagnosis not present

## 2020-10-16 DIAGNOSIS — J3801 Paralysis of vocal cords and larynx, unilateral: Secondary | ICD-10-CM | POA: Diagnosis not present

## 2020-10-16 DIAGNOSIS — J384 Edema of larynx: Secondary | ICD-10-CM | POA: Diagnosis not present

## 2020-10-16 DIAGNOSIS — J383 Other diseases of vocal cords: Secondary | ICD-10-CM | POA: Diagnosis not present

## 2020-10-18 DIAGNOSIS — E890 Postprocedural hypothyroidism: Secondary | ICD-10-CM | POA: Insufficient documentation

## 2020-10-18 DIAGNOSIS — C73 Malignant neoplasm of thyroid gland: Secondary | ICD-10-CM | POA: Diagnosis not present

## 2020-10-18 HISTORY — DX: Postprocedural hypothyroidism: E89.0

## 2020-10-20 DIAGNOSIS — E109 Type 1 diabetes mellitus without complications: Secondary | ICD-10-CM | POA: Diagnosis not present

## 2020-10-20 DIAGNOSIS — M2041 Other hammer toe(s) (acquired), right foot: Secondary | ICD-10-CM | POA: Diagnosis not present

## 2020-10-20 DIAGNOSIS — L84 Corns and callosities: Secondary | ICD-10-CM | POA: Diagnosis not present

## 2020-10-20 DIAGNOSIS — M79671 Pain in right foot: Secondary | ICD-10-CM | POA: Diagnosis not present

## 2020-12-25 DIAGNOSIS — Z0000 Encounter for general adult medical examination without abnormal findings: Secondary | ICD-10-CM | POA: Diagnosis not present

## 2020-12-25 DIAGNOSIS — E039 Hypothyroidism, unspecified: Secondary | ICD-10-CM | POA: Diagnosis not present

## 2020-12-25 DIAGNOSIS — R490 Dysphonia: Secondary | ICD-10-CM | POA: Diagnosis not present

## 2020-12-25 DIAGNOSIS — C73 Malignant neoplasm of thyroid gland: Secondary | ICD-10-CM | POA: Diagnosis not present

## 2020-12-25 DIAGNOSIS — R760 Raised antibody titer: Secondary | ICD-10-CM | POA: Diagnosis not present

## 2020-12-25 DIAGNOSIS — M25519 Pain in unspecified shoulder: Secondary | ICD-10-CM | POA: Diagnosis not present

## 2020-12-25 DIAGNOSIS — E1040 Type 1 diabetes mellitus with diabetic neuropathy, unspecified: Secondary | ICD-10-CM | POA: Diagnosis not present

## 2020-12-25 DIAGNOSIS — E785 Hyperlipidemia, unspecified: Secondary | ICD-10-CM | POA: Diagnosis not present

## 2020-12-25 DIAGNOSIS — Z1389 Encounter for screening for other disorder: Secondary | ICD-10-CM | POA: Diagnosis not present

## 2020-12-25 DIAGNOSIS — Z1331 Encounter for screening for depression: Secondary | ICD-10-CM | POA: Diagnosis not present

## 2020-12-25 DIAGNOSIS — R82998 Other abnormal findings in urine: Secondary | ICD-10-CM | POA: Diagnosis not present

## 2020-12-25 DIAGNOSIS — Z794 Long term (current) use of insulin: Secondary | ICD-10-CM | POA: Diagnosis not present

## 2021-05-15 HISTORY — PX: CATARACT EXTRACTION: SUR2

## 2021-06-19 HISTORY — PX: CATARACT EXTRACTION: SUR2

## 2021-09-10 DIAGNOSIS — M2560 Stiffness of unspecified joint, not elsewhere classified: Secondary | ICD-10-CM

## 2021-09-10 DIAGNOSIS — M79642 Pain in left hand: Secondary | ICD-10-CM

## 2021-09-10 DIAGNOSIS — E11618 Type 2 diabetes mellitus with other diabetic arthropathy: Secondary | ICD-10-CM

## 2021-09-10 DIAGNOSIS — M79641 Pain in right hand: Secondary | ICD-10-CM

## 2021-09-10 HISTORY — DX: Stiffness of unspecified joint, not elsewhere classified: M25.60

## 2021-09-10 HISTORY — DX: Pain in right hand: M79.642

## 2021-09-10 HISTORY — DX: Pain in right hand: M79.641

## 2021-09-10 HISTORY — DX: Type 2 diabetes mellitus with other diabetic arthropathy: E11.618

## 2021-10-15 DIAGNOSIS — M25641 Stiffness of right hand, not elsewhere classified: Secondary | ICD-10-CM | POA: Diagnosis not present

## 2021-10-15 DIAGNOSIS — M79642 Pain in left hand: Secondary | ICD-10-CM | POA: Diagnosis not present

## 2021-10-15 DIAGNOSIS — M25642 Stiffness of left hand, not elsewhere classified: Secondary | ICD-10-CM | POA: Diagnosis not present

## 2021-10-15 DIAGNOSIS — R609 Edema, unspecified: Secondary | ICD-10-CM | POA: Diagnosis not present

## 2021-10-15 DIAGNOSIS — E11618 Type 2 diabetes mellitus with other diabetic arthropathy: Secondary | ICD-10-CM | POA: Diagnosis not present

## 2021-10-15 DIAGNOSIS — M79641 Pain in right hand: Secondary | ICD-10-CM | POA: Diagnosis not present

## 2021-10-19 DIAGNOSIS — M79641 Pain in right hand: Secondary | ICD-10-CM | POA: Diagnosis not present

## 2021-10-19 DIAGNOSIS — E11618 Type 2 diabetes mellitus with other diabetic arthropathy: Secondary | ICD-10-CM | POA: Diagnosis not present

## 2021-10-19 DIAGNOSIS — M25642 Stiffness of left hand, not elsewhere classified: Secondary | ICD-10-CM | POA: Diagnosis not present

## 2021-10-19 DIAGNOSIS — M25641 Stiffness of right hand, not elsewhere classified: Secondary | ICD-10-CM | POA: Diagnosis not present

## 2021-10-19 DIAGNOSIS — M79642 Pain in left hand: Secondary | ICD-10-CM | POA: Diagnosis not present

## 2021-10-19 DIAGNOSIS — R609 Edema, unspecified: Secondary | ICD-10-CM | POA: Diagnosis not present

## 2021-10-24 DIAGNOSIS — M79641 Pain in right hand: Secondary | ICD-10-CM | POA: Diagnosis not present

## 2021-10-24 DIAGNOSIS — M79642 Pain in left hand: Secondary | ICD-10-CM | POA: Diagnosis not present

## 2021-10-24 DIAGNOSIS — R609 Edema, unspecified: Secondary | ICD-10-CM | POA: Diagnosis not present

## 2021-10-24 DIAGNOSIS — E11618 Type 2 diabetes mellitus with other diabetic arthropathy: Secondary | ICD-10-CM | POA: Diagnosis not present

## 2021-10-24 DIAGNOSIS — M25642 Stiffness of left hand, not elsewhere classified: Secondary | ICD-10-CM | POA: Diagnosis not present

## 2021-10-24 DIAGNOSIS — M25641 Stiffness of right hand, not elsewhere classified: Secondary | ICD-10-CM | POA: Diagnosis not present

## 2021-10-29 DIAGNOSIS — C73 Malignant neoplasm of thyroid gland: Secondary | ICD-10-CM | POA: Diagnosis not present

## 2021-10-29 DIAGNOSIS — E109 Type 1 diabetes mellitus without complications: Secondary | ICD-10-CM | POA: Diagnosis not present

## 2021-10-29 DIAGNOSIS — R0789 Other chest pain: Secondary | ICD-10-CM | POA: Diagnosis not present

## 2021-10-29 DIAGNOSIS — R079 Chest pain, unspecified: Secondary | ICD-10-CM | POA: Diagnosis not present

## 2021-10-29 DIAGNOSIS — Z794 Long term (current) use of insulin: Secondary | ICD-10-CM | POA: Diagnosis not present

## 2021-10-29 DIAGNOSIS — E038 Other specified hypothyroidism: Secondary | ICD-10-CM | POA: Diagnosis not present

## 2021-10-29 DIAGNOSIS — R911 Solitary pulmonary nodule: Secondary | ICD-10-CM | POA: Diagnosis not present

## 2021-10-29 DIAGNOSIS — Z7982 Long term (current) use of aspirin: Secondary | ICD-10-CM | POA: Diagnosis not present

## 2021-10-29 DIAGNOSIS — E7800 Pure hypercholesterolemia, unspecified: Secondary | ICD-10-CM | POA: Diagnosis not present

## 2021-10-29 DIAGNOSIS — Z79899 Other long term (current) drug therapy: Secondary | ICD-10-CM | POA: Diagnosis not present

## 2021-10-29 DIAGNOSIS — R109 Unspecified abdominal pain: Secondary | ICD-10-CM | POA: Diagnosis not present

## 2021-11-16 DIAGNOSIS — U071 COVID-19: Secondary | ICD-10-CM | POA: Diagnosis not present

## 2021-11-19 DIAGNOSIS — R0789 Other chest pain: Secondary | ICD-10-CM | POA: Diagnosis not present

## 2021-11-26 DIAGNOSIS — C73 Malignant neoplasm of thyroid gland: Secondary | ICD-10-CM | POA: Diagnosis not present

## 2021-11-26 DIAGNOSIS — E890 Postprocedural hypothyroidism: Secondary | ICD-10-CM | POA: Diagnosis not present

## 2021-11-27 ENCOUNTER — Encounter: Payer: Self-pay | Admitting: *Deleted

## 2021-11-27 ENCOUNTER — Encounter: Payer: Self-pay | Admitting: Cardiology

## 2021-11-28 DIAGNOSIS — M25641 Stiffness of right hand, not elsewhere classified: Secondary | ICD-10-CM | POA: Diagnosis not present

## 2021-11-28 DIAGNOSIS — M25642 Stiffness of left hand, not elsewhere classified: Secondary | ICD-10-CM | POA: Diagnosis not present

## 2021-11-28 DIAGNOSIS — E11618 Type 2 diabetes mellitus with other diabetic arthropathy: Secondary | ICD-10-CM | POA: Diagnosis not present

## 2021-11-28 DIAGNOSIS — R609 Edema, unspecified: Secondary | ICD-10-CM | POA: Diagnosis not present

## 2021-11-28 DIAGNOSIS — M79642 Pain in left hand: Secondary | ICD-10-CM | POA: Diagnosis not present

## 2021-11-28 DIAGNOSIS — M79641 Pain in right hand: Secondary | ICD-10-CM | POA: Diagnosis not present

## 2021-11-29 DIAGNOSIS — E7800 Pure hypercholesterolemia, unspecified: Secondary | ICD-10-CM | POA: Insufficient documentation

## 2021-12-04 ENCOUNTER — Ambulatory Visit (INDEPENDENT_AMBULATORY_CARE_PROVIDER_SITE_OTHER): Payer: Medicare Other | Admitting: Cardiology

## 2021-12-04 ENCOUNTER — Other Ambulatory Visit: Payer: Self-pay

## 2021-12-04 ENCOUNTER — Encounter: Payer: Self-pay | Admitting: Cardiology

## 2021-12-04 VITALS — BP 122/66 | HR 97 | Ht 64.0 in | Wt 160.4 lb

## 2021-12-04 DIAGNOSIS — E7800 Pure hypercholesterolemia, unspecified: Secondary | ICD-10-CM | POA: Diagnosis not present

## 2021-12-04 DIAGNOSIS — R079 Chest pain, unspecified: Secondary | ICD-10-CM | POA: Diagnosis not present

## 2021-12-04 DIAGNOSIS — I10 Essential (primary) hypertension: Secondary | ICD-10-CM | POA: Diagnosis not present

## 2021-12-04 DIAGNOSIS — E088 Diabetes mellitus due to underlying condition with unspecified complications: Secondary | ICD-10-CM | POA: Insufficient documentation

## 2021-12-04 NOTE — Progress Notes (Signed)
Cardiology Office Note:    Date:  12/04/2021   ID:  Deborah [name], DOB 1951/05/24, MRN [id number]  PCP:  Shon Baton, MD  Cardiologist:  Jenean Lindau, MD   Referring MD: Shon Baton, MD    ASSESSMENT:    1. Chest pain of uncertain etiology   2. Chest pain, unspecified type   3. Essential hypertension   4. Hypercholesterolemia   5. Diabetes mellitus due to underlying condition with unspecified complications ([place])    PLAN:    In order of problems listed above:  Primary prevention stressed with the patient.  Importance of compliance with diet medication stressed and she vocalized understanding. Dyspnea on exertion: She has multiple risk factors for coronary artery disease and has symptoms concerning so she will get an exercise stress Cardiolite Essential hypertension: Blood pressure stable and diet was emphasized. Mixed dyslipidemia and diabetes mellitus: On statin therapy and managed by primary care. Patient will be seen in follow-up appointment in 6 months or earlier if the patient has any concerns    Medication Adjustments/Labs and Tests Ordered: Current medicines are reviewed at length with the patient today.  Concerns regarding medicines are outlined above.  Orders Placed This Encounter  Procedures   MYOCARDIAL PERFUSION IMAGING   EKG 12-Lead   No orders of the defined types were placed in this encounter.    History of Present Illness:    Deborah [name] is a 71 y.o. female who is being seen today for the evaluation of chest pain and dyspnea on exertion at the request of Shon Baton, MD. patient is a pleasant 71 year old female.  She has a past medical history of essential hypertension dyslipidemia and diabetes mellitus.  She denies any problems at this time.  She was in [place] and she got short of breath and was admitted to the hospital.  She is a retired [employer] scientist.  She mentions to me that a CT scan was done and a pulmonary embolism was ruled out.  Subsequently she  is done fine.  She complains to me that she still has some dyspnea on exertion.  No chest pain orthopnea or PND.  At the time of my evaluation, the patient is alert awake oriented and in no distress.  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 Added automatically from request for surgery [id number] Formatting of this note might be different from the original. Formatting of this note might be different from the original. 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   atorvastatin (LIPITOR) 20 MG tablet Take 1 tablet by mouth daily.   fosinopril (MONOPRIL) 10 MG tablet Take 5 mg by mouth daily. Takes 1/2 tab daily; renal prophylaxis no hypertension   insulin aspart (NOVOLOG) 100 UNIT/ML injection  Inject 50 Units into the skin daily. Inject up to 50 units daily via pump   levothyroxine (SYNTHROID) 88 MCG tablet Take 1 tablet by mouth daily. And takes 132 mcg once a week on Fridays   omeprazole (PRILOSEC) 40 MG capsule Take 1 capsule by mouth daily.     Allergies:   Patient has no known allergies.   Social History   Socioeconomic History   Marital status: Married    Spouse name: Not on file   Number of children: Not on file   Years of education: Not on file   Highest education level: Not on file  Occupational History   Not on file  Tobacco Use   Smoking status: Never   Smokeless tobacco: Never  Substance and Sexual Activity   Alcohol use: Never   Drug use: Never   Sexual activity: Not on file  Other Topics Concern   Not on file  Social History Narrative   Not on file   Social Determinants of Health   Financial Resource Strain: Not on file  Food Insecurity: Not on file  Transportation Needs: Not on file  Physical Activity: Not on file  Stress: Not on file  Social Connections: Not on file     Family History: The patient's family history includes Breast cancer in her sister; CAD in her father; Diabetes in her mother; Heart failure in her father; Hypertension in her mother. There is no history of Colon cancer, Pancreatic cancer, or Stomach cancer.  ROS:   Please see the history of present illness.    All other systems reviewed and are negative.  EKGs/Labs/Other Studies Reviewed:    The following studies were reviewed today: EKG reveals sinus rhythm and nonspecific ST-T changes   Recent Labs: No results found for requested labs within last 8760 hours.  Recent Lipid Panel    Component Value Date/Time   CHOL  02/16/2009 0605    169        ATP III CLASSIFICATION:  <200     mg/dL   Desirable  200-239  mg/dL   Borderline High  >=240    mg/dL   High          TRIG 33 02/16/2009 0605   HDL 57 02/16/2009 0605   CHOLHDL 3.0 02/16/2009 0605   VLDL 7 02/16/2009  0605   LDLCALC (H) 02/16/2009 0605    105        Total Cholesterol/HDL:CHD Risk Coronary Heart Disease Risk Table                     Men   Women  1/2 Average Risk   3.4   3.3  Average Risk       5.0   4.4  2 X Average Risk   9.6   7.1  3 X Average Risk  23.4   11.0        Use the calculated Patient Ratio above and the CHD Risk Table to determine the patient's CHD Risk.  ATP III CLASSIFICATION (LDL):  <100     mg/dL   Optimal  100-129  mg/dL   Near or Above                    Optimal  130-159  mg/dL   Borderline  160-189  mg/dL   High  >190     mg/dL   Very High    Physical Exam:    VS:  BP 122/66    Pulse 97    Ht 5\' 4"  (1.626 m)    Wt 160 lb 6.4 oz (72.8 kg)    BMI 27.53 kg/m     Wt Readings from Last 3 Encounters:  12/04/21 160 lb 6.4 oz (72.8 kg)  11/19/21 162 lb (73.5 kg)  11/05/13 185 lb (83.9 kg)     GEN: Patient is in no acute distress HEENT: Normal NECK: No JVD; No carotid bruits LYMPHATICS: No lymphadenopathy CARDIAC: S1 S2 regular, 2/6 systolic murmur at the apex. RESPIRATORY:  Clear to auscultation without rales, wheezing or rhonchi  ABDOMEN: Soft, non-tender, non-distended MUSCULOSKELETAL:  No edema; No deformity  SKIN: Warm and dry NEUROLOGIC:  Alert and oriented x 3 PSYCHIATRIC:  Normal affect    Signed, Jenean Lindau, MD  12/04/2021 1:47 PM     [facility]

## 2021-12-04 NOTE — Patient Instructions (Signed)
Medication Instructions:  Your physician recommends that you continue on your current medications as directed. Please refer to the Current Medication list given to you today.  *If you need a refill on your cardiac medications before your next appointment, please call your pharmacy*   Lab Work: None ordered If you have labs (blood work) drawn today and your tests are completely normal, you will receive your results only by: [place] (if you have MyChart) OR A paper copy in the mail If you have any lab test that is abnormal or we need to change your treatment, we will call you to review the results.   Testing/Procedures: Your physician has requested that you have a lexiscan myoview. For further information please visit [email or web address]. Please follow instruction sheet, as given.  The test will take approximately 3 to 4 hours to complete; you may bring reading material.  If someone comes with you to your appointment, they will need to remain in the main lobby due to limited space in the testing area. **If you are pregnant or breastfeeding, please notify the nuclear lab prior to your appointment**  How to prepare for your Myocardial Perfusion Test: Do not eat or drink 3 hours prior to your test, except you may have water. Do not consume products containing caffeine (regular or decaffeinated) 12 hours prior to your test. (ex: coffee, chocolate, sodas, tea). Do bring a list of your current medications with you.  If not listed below, you may take your medications as normal. Do wear comfortable clothes (no dresses or overalls) and walking shoes, tennis shoes preferred (No heels or open toe shoes are allowed). Do NOT wear cologne, perfume, aftershave, or lotions (deodorant is allowed). If these instructions are not followed, your test will have to be rescheduled.    Follow-Up: At [facility], you and your health needs are our priority.  As part of our continuing mission to provide  you with exceptional heart care, we have created designated Provider Care Teams.  These Care Teams include your primary Cardiologist (physician) and Advanced Practice Providers (APPs -  Physician Assistants and Nurse Practitioners) who all work together to provide you with the care you need, when you need it.  We recommend signing up for the patient portal called "MyChart".  Sign up information is provided on this After Visit Summary.  MyChart is used to connect with patients for Virtual Visits (Telemedicine).  Patients are able to view lab/test results, encounter notes, upcoming appointments, etc.  Non-urgent messages can be sent to your provider as well.   To learn more about what you can do with MyChart, go to [email or web address].    Your next appointment:   6 month(s)  The format for your next appointment:   In Person  Provider:   Jyl Heinz, MD   Other Instructions Cardiac Nuclear Scan A cardiac nuclear scan is a test that is done to check the flow of blood to your heart. It is done when you are resting and when you are exercising. The test looks for problems such as: Not enough blood reaching a portion of the heart. The heart muscle not working as it should. You may need this test if: You have heart disease. You have had lab results that are not normal. You have had heart surgery or a balloon procedure to open up blocked arteries (angioplasty). You have chest pain. You have shortness of breath. In this test, a special dye (tracer) is put into your bloodstream. The tracer will travel  to your heart. A camera will then take pictures of your heart to see how the tracer moves through your heart. This test is usually done at a hospital and takes 2-4 hours. Tell a doctor about: Any allergies you have. All medicines you are taking, including vitamins, herbs, eye drops, creams, and over-the-counter medicines. Any problems you or family members have had with anesthetic  medicines. Any blood disorders you have. Any surgeries you have had. Any medical conditions you have. Whether you are pregnant or may be pregnant. What are the risks? Generally, this is a safe test. However, problems may occur, such as: Serious chest pain and heart attack. This is only a risk if the stress portion of the test is done. Rapid heartbeat. A feeling of warmth in your chest. This feeling usually does not last long. Allergic reaction to the tracer. What happens before the test? Ask your doctor about changing or stopping your normal medicines. This is important. Follow instructions from your doctor about what you cannot eat or drink. Remove your jewelry on the day of the test. What happens during the test? An IV tube will be inserted into one of your veins. Your doctor will give you a small amount of tracer through the IV tube. You will wait for 20-40 minutes while the tracer moves through your bloodstream. Your heart will be monitored with an electrocardiogram (ECG). You will lie down on an exam table. Pictures of your heart will be taken for about 15-20 minutes. You may also have a stress test. For this test, one of these things may be done: You will be asked to exercise on a treadmill or a stationary bike. You will be given medicines that will make your heart work harder. This is done if you are unable to exercise. When blood flow to your heart has peaked, a tracer will again be given through the IV tube. After 20-40 minutes, you will get back on the exam table. More pictures will be taken of your heart. Depending on the tracer that is used, more pictures may need to be taken 3-4 hours later. Your IV tube will be removed when the test is over. The test may vary among doctors and hospitals. What happens after the test? Ask your doctor: Whether you can return to your normal schedule, including diet, activities, and medicines. Whether you should drink more fluids. This will  help to remove the tracer from your body. Drink enough fluid to keep your pee (urine) pale yellow. Ask your doctor, or the department that is doing the test: When will my results be ready? How will I get my results? Summary A cardiac nuclear scan is a test that is done to check the flow of blood to your heart. Tell your doctor whether you are pregnant or may be pregnant. Before the test, ask your doctor about changing or stopping your normal medicines. This is important. Ask your doctor whether you can return to your normal activities. You may be asked to drink more fluids. This information is not intended to replace advice given to you by your health care provider. Make sure you discuss any questions you have with your health care provider. Document Revised: 01/13/2019 Document Reviewed: 03/09/2018 Elsevier Patient Education  [place].

## 2021-12-04 NOTE — Addendum Note (Signed)
Addended by: Truddie Hidden on: 12/04/2021 01:54 PM   Modules accepted: Orders

## 2021-12-04 NOTE — Addendum Note (Signed)
Addended by: Jyl Heinz R on: 12/04/2021 01:58 PM   Modules accepted: Orders

## 2021-12-05 ENCOUNTER — Telehealth (HOSPITAL_COMMUNITY): Payer: Self-pay | Admitting: *Deleted

## 2021-12-05 NOTE — Telephone Encounter (Signed)
Patient given detailed instructions per Myocardial Perfusion Study Information Sheet for the test on 12/12/21 at 0815. Patient notified to arrive 15 minutes early and that it is imperative to arrive on time for appointment to keep from having the test rescheduled. ? If you need to cancel or reschedule your appointment, please call the office within 24 hours of your appointment. . Patient verbalized understanding.Audree Schrecengost, Ranae Palms ? ? ?

## 2021-12-12 ENCOUNTER — Other Ambulatory Visit: Payer: Self-pay

## 2021-12-12 ENCOUNTER — Ambulatory Visit (INDEPENDENT_AMBULATORY_CARE_PROVIDER_SITE_OTHER): Payer: Medicare Other

## 2021-12-12 DIAGNOSIS — R079 Chest pain, unspecified: Secondary | ICD-10-CM

## 2021-12-12 MED ORDER — TECHNETIUM TC 99M TETROFOSMIN IV KIT
10.4000 | PACK | Freq: Once | INTRAVENOUS | Status: AC | PRN
  Administered 2021-12-12: 10.4 via INTRAVENOUS

## 2021-12-12 MED ORDER — TECHNETIUM TC 99M TETROFOSMIN IV KIT
30.8000 | PACK | Freq: Once | INTRAVENOUS | Status: AC | PRN
  Administered 2021-12-12: 30.8 via INTRAVENOUS

## 2021-12-14 ENCOUNTER — Telehealth: Payer: Self-pay | Admitting: Cardiology

## 2021-12-14 NOTE — Telephone Encounter (Signed)
° ° °  Pt is calling to get stress test result °

## 2021-12-14 NOTE — Telephone Encounter (Signed)
Called pt informed that MD has not resulted stress test at this time. Pt asked if it would be today advised that I don't think so.  Pt thanked me for returning call.  ?

## 2021-12-17 ENCOUNTER — Encounter: Payer: Self-pay | Admitting: Cardiology

## 2021-12-18 ENCOUNTER — Encounter: Payer: Self-pay | Admitting: Cardiology

## 2021-12-18 ENCOUNTER — Telehealth: Payer: Self-pay

## 2021-12-18 DIAGNOSIS — R072 Precordial pain: Secondary | ICD-10-CM

## 2021-12-18 DIAGNOSIS — R0609 Other forms of dyspnea: Secondary | ICD-10-CM

## 2021-12-18 DIAGNOSIS — R079 Chest pain, unspecified: Secondary | ICD-10-CM

## 2021-12-18 LAB — MYOCARDIAL PERFUSION IMAGING
Angina Index: 0
Estimated workload: 7.2
Exercise duration (min): 4 min
LV dias vol: 66 mL (ref 46–106)
LV sys vol: 21 mL
MPHR: 150 {beats}/min
Nuc Stress EF: 68 %
Peak HR: 153 {beats}/min
Percent HR: 150 %
RPE: 18
Rest HR: 70 {beats}/min
Rest Nuclear Isotope Dose: 10.4 mCi
SDS: 4
SRS: 6
SSS: 10
Stress Nuclear Isotope Dose: 30.8 mCi
TID: 0.93

## 2021-12-18 MED ORDER — NITROGLYCERIN 0.4 MG SL SUBL: 0.4000 mg | SUBLINGUAL_TABLET | SUBLINGUAL | 6 refills | Status: AC | PRN

## 2021-12-18 MED ORDER — METOPROLOL TARTRATE 100 MG PO TABS
100.0000 mg | ORAL_TABLET | Freq: Once | ORAL | 0 refills | Status: AC
Start: 2021-12-18 — End: 2021-12-18

## 2021-12-18 NOTE — Telephone Encounter (Signed)
-----   Message from Jenean Lindau, MD sent at 12/18/2021  3:49 PM EDT ----- ?As discussed CT coronary angiography with FFR.  Copy primary care ?Jenean Lindau, MD 12/18/2021 3:49 PM  ?

## 2021-12-18 NOTE — Telephone Encounter (Signed)
Results reviewed with pt as per Dr. Julien Nordmann note.  Pt verbalized understanding and had no additional questions. ?Routed to PCP. ?Cardiac CT ordered. Instructions sent via MyChart and discussed on phone call. ?

## 2021-12-26 DIAGNOSIS — M2560 Stiffness of unspecified joint, not elsewhere classified: Secondary | ICD-10-CM | POA: Diagnosis not present

## 2021-12-26 DIAGNOSIS — R0609 Other forms of dyspnea: Secondary | ICD-10-CM | POA: Diagnosis not present

## 2021-12-26 DIAGNOSIS — R079 Chest pain, unspecified: Secondary | ICD-10-CM | POA: Diagnosis not present

## 2021-12-26 DIAGNOSIS — E11618 Type 2 diabetes mellitus with other diabetic arthropathy: Secondary | ICD-10-CM | POA: Diagnosis not present

## 2021-12-27 LAB — BASIC METABOLIC PANEL
BUN/Creatinine Ratio: 16 (ref 12–28)
BUN: 12 mg/dL (ref 8–27)
CO2: 23 mmol/L (ref 20–29)
Calcium: 9.2 mg/dL (ref 8.7–10.3)
Chloride: 102 mmol/L (ref 96–106)
Creatinine, Ser: 0.77 mg/dL (ref 0.57–1.00)
Glucose: 65 mg/dL — ABNORMAL LOW (ref 70–99)
Potassium: 4.6 mmol/L (ref 3.5–5.2)
Sodium: 140 mmol/L (ref 134–144)
eGFR: 83 mL/min/{1.73_m2} (ref >59)

## 2021-12-31 ENCOUNTER — Telehealth (HOSPITAL_COMMUNITY): Payer: Self-pay | Admitting: Emergency Medicine

## 2021-12-31 NOTE — Telephone Encounter (Signed)
Reaching out to patient to offer assistance regarding upcoming cardiac imaging study; pt verbalizes understanding of appt date/time, parking situation and where to check in, pre-test NPO status and medications ordered, and verified current allergies; name and call back number provided for further questions should they arise ?Marchia Bond RN Navigator Cardiac Imaging ?[facility] ?[phone] office ?[phone] cell ? ?Denies iv issues  ?'100mg'$  metoprolol tartrate  ?Arrival 130 ? ?

## 2021-12-31 NOTE — Telephone Encounter (Signed)
Attempted to call patient regarding upcoming cardiac CT appointment. °Left message on voicemail with name and callback number °Kainon Varady RN Navigator Cardiac Imaging °[facility] °[phone] Office °[phone] Cell ° °

## 2022-01-01 ENCOUNTER — Encounter (HOSPITAL_COMMUNITY): Payer: Self-pay

## 2022-01-01 ENCOUNTER — Telehealth: Payer: Self-pay | Admitting: Cardiology

## 2022-01-01 ENCOUNTER — Ambulatory Visit (HOSPITAL_COMMUNITY)
Admission: RE | Admit: 2022-01-01 | Discharge: 2022-01-01 | Disposition: A | Discharge status: Home / Self Care | Payer: Medicare Other | Source: Ambulatory Visit | Attending: Cardiology | Admitting: Cardiology

## 2022-01-01 ENCOUNTER — Other Ambulatory Visit: Payer: Self-pay

## 2022-01-01 DIAGNOSIS — R0609 Other forms of dyspnea: Secondary | ICD-10-CM | POA: Insufficient documentation

## 2022-01-01 DIAGNOSIS — R072 Precordial pain: Secondary | ICD-10-CM | POA: Diagnosis not present

## 2022-01-01 DIAGNOSIS — R079 Chest pain, unspecified: Secondary | ICD-10-CM | POA: Insufficient documentation

## 2022-01-01 MED ORDER — NITROGLYCERIN 0.4 MG SL SUBL
SUBLINGUAL_TABLET | SUBLINGUAL | Status: AC
  Filled 2022-01-01: qty 2

## 2022-01-01 MED ORDER — METOPROLOL TARTRATE 5 MG/5ML IV SOLN
10.0000 mg | INTRAVENOUS | Status: DC | PRN
Start: 2022-01-01 — End: 2022-01-01

## 2022-01-01 MED ORDER — NITROGLYCERIN 0.4 MG SL SUBL
0.8000 mg | SUBLINGUAL_TABLET | Freq: Once | SUBLINGUAL | Status: AC
  Administered 2022-01-01: 0.8 mg via SUBLINGUAL

## 2022-01-01 MED ORDER — IOHEXOL 350 MG/ML SOLN
100.0000 mL | Freq: Once | INTRAVENOUS | Status: AC | PRN
  Administered 2022-01-01: 100 mL via INTRAVENOUS

## 2022-01-01 NOTE — Telephone Encounter (Signed)
Patient is returning CMA's call.  ?

## 2022-01-02 NOTE — Telephone Encounter (Signed)
Results reviewed with pt as per Dr. Revankar's note.  Pt verbalized understanding and had no additional questions. Routed to PCP.  

## 2022-01-02 NOTE — Telephone Encounter (Signed)
° °  Pt is calling back to f/u °

## 2022-01-03 DIAGNOSIS — E785 Hyperlipidemia, unspecified: Secondary | ICD-10-CM | POA: Diagnosis not present

## 2022-01-03 DIAGNOSIS — M8580 Other specified disorders of bone density and structure, unspecified site: Secondary | ICD-10-CM | POA: Diagnosis not present

## 2022-01-03 DIAGNOSIS — E559 Vitamin D deficiency, unspecified: Secondary | ICD-10-CM | POA: Diagnosis not present

## 2022-01-03 DIAGNOSIS — E1040 Type 1 diabetes mellitus with diabetic neuropathy, unspecified: Secondary | ICD-10-CM | POA: Diagnosis not present

## 2022-01-03 DIAGNOSIS — E039 Hypothyroidism, unspecified: Secondary | ICD-10-CM | POA: Diagnosis not present

## 2022-01-10 DIAGNOSIS — E559 Vitamin D deficiency, unspecified: Secondary | ICD-10-CM | POA: Diagnosis not present

## 2022-01-10 DIAGNOSIS — Z1389 Encounter for screening for other disorder: Secondary | ICD-10-CM | POA: Diagnosis not present

## 2022-01-10 DIAGNOSIS — R0789 Other chest pain: Secondary | ICD-10-CM | POA: Diagnosis not present

## 2022-01-10 DIAGNOSIS — M25519 Pain in unspecified shoulder: Secondary | ICD-10-CM | POA: Diagnosis not present

## 2022-01-10 DIAGNOSIS — Z23 Encounter for immunization: Secondary | ICD-10-CM | POA: Diagnosis not present

## 2022-01-10 DIAGNOSIS — E785 Hyperlipidemia, unspecified: Secondary | ICD-10-CM | POA: Diagnosis not present

## 2022-01-10 DIAGNOSIS — E1040 Type 1 diabetes mellitus with diabetic neuropathy, unspecified: Secondary | ICD-10-CM | POA: Diagnosis not present

## 2022-01-10 DIAGNOSIS — R82998 Other abnormal findings in urine: Secondary | ICD-10-CM | POA: Diagnosis not present

## 2022-01-10 DIAGNOSIS — M79641 Pain in right hand: Secondary | ICD-10-CM | POA: Diagnosis not present

## 2022-01-10 DIAGNOSIS — Z0000 Encounter for general adult medical examination without abnormal findings: Secondary | ICD-10-CM | POA: Diagnosis not present

## 2022-01-10 DIAGNOSIS — R760 Raised antibody titer: Secondary | ICD-10-CM | POA: Diagnosis not present

## 2022-01-10 DIAGNOSIS — I10 Essential (primary) hypertension: Secondary | ICD-10-CM | POA: Diagnosis not present

## 2022-01-10 DIAGNOSIS — E039 Hypothyroidism, unspecified: Secondary | ICD-10-CM | POA: Diagnosis not present

## 2022-01-10 DIAGNOSIS — Z794 Long term (current) use of insulin: Secondary | ICD-10-CM | POA: Diagnosis not present

## 2022-01-10 DIAGNOSIS — Z1331 Encounter for screening for depression: Secondary | ICD-10-CM | POA: Diagnosis not present

## 2022-01-10 DIAGNOSIS — M8580 Other specified disorders of bone density and structure, unspecified site: Secondary | ICD-10-CM | POA: Diagnosis not present

## 2022-01-30 DIAGNOSIS — J029 Acute pharyngitis, unspecified: Secondary | ICD-10-CM | POA: Diagnosis not present

## 2022-01-31 DIAGNOSIS — G8929 Other chronic pain: Secondary | ICD-10-CM | POA: Diagnosis not present

## 2022-01-31 DIAGNOSIS — M25511 Pain in right shoulder: Secondary | ICD-10-CM | POA: Diagnosis not present

## 2022-02-04 DIAGNOSIS — M25511 Pain in right shoulder: Secondary | ICD-10-CM | POA: Diagnosis not present

## 2022-02-04 DIAGNOSIS — G8929 Other chronic pain: Secondary | ICD-10-CM | POA: Diagnosis not present

## 2022-02-07 DIAGNOSIS — Z1212 Encounter for screening for malignant neoplasm of rectum: Secondary | ICD-10-CM | POA: Diagnosis not present

## 2022-02-07 DIAGNOSIS — Z1211 Encounter for screening for malignant neoplasm of colon: Secondary | ICD-10-CM | POA: Diagnosis not present

## 2022-02-19 DIAGNOSIS — E890 Postprocedural hypothyroidism: Secondary | ICD-10-CM | POA: Diagnosis not present

## 2022-02-19 DIAGNOSIS — C73 Malignant neoplasm of thyroid gland: Secondary | ICD-10-CM | POA: Diagnosis not present

## 2022-02-19 DIAGNOSIS — Z85850 Personal history of malignant neoplasm of thyroid: Secondary | ICD-10-CM | POA: Diagnosis not present

## 2022-02-19 DIAGNOSIS — J3801 Paralysis of vocal cords and larynx, unilateral: Secondary | ICD-10-CM | POA: Diagnosis not present

## 2022-02-19 DIAGNOSIS — Z923 Personal history of irradiation: Secondary | ICD-10-CM | POA: Diagnosis not present

## 2022-02-19 DIAGNOSIS — Z79890 Hormone replacement therapy (postmenopausal): Secondary | ICD-10-CM | POA: Diagnosis not present

## 2022-02-19 DIAGNOSIS — R911 Solitary pulmonary nodule: Secondary | ICD-10-CM | POA: Diagnosis not present

## 2022-02-21 DIAGNOSIS — C73 Malignant neoplasm of thyroid gland: Secondary | ICD-10-CM | POA: Diagnosis not present

## 2022-02-21 DIAGNOSIS — R918 Other nonspecific abnormal finding of lung field: Secondary | ICD-10-CM | POA: Diagnosis not present

## 2022-03-11 DIAGNOSIS — E109 Type 1 diabetes mellitus without complications: Secondary | ICD-10-CM | POA: Diagnosis not present

## 2022-03-11 DIAGNOSIS — Z01812 Encounter for preprocedural laboratory examination: Secondary | ICD-10-CM | POA: Diagnosis not present

## 2022-03-11 DIAGNOSIS — C73 Malignant neoplasm of thyroid gland: Secondary | ICD-10-CM | POA: Diagnosis not present

## 2022-03-18 DIAGNOSIS — R001 Bradycardia, unspecified: Secondary | ICD-10-CM | POA: Diagnosis not present

## 2022-03-18 DIAGNOSIS — D383 Neoplasm of uncertain behavior of mediastinum: Secondary | ICD-10-CM | POA: Diagnosis not present

## 2022-03-18 DIAGNOSIS — J3801 Paralysis of vocal cords and larynx, unilateral: Secondary | ICD-10-CM | POA: Diagnosis not present

## 2022-03-18 DIAGNOSIS — Z85850 Personal history of malignant neoplasm of thyroid: Secondary | ICD-10-CM | POA: Diagnosis not present

## 2022-03-18 DIAGNOSIS — C73 Malignant neoplasm of thyroid gland: Secondary | ICD-10-CM | POA: Diagnosis not present

## 2022-03-18 DIAGNOSIS — E119 Type 2 diabetes mellitus without complications: Secondary | ICD-10-CM | POA: Diagnosis not present

## 2022-03-18 DIAGNOSIS — Z794 Long term (current) use of insulin: Secondary | ICD-10-CM | POA: Diagnosis not present

## 2022-03-18 DIAGNOSIS — R911 Solitary pulmonary nodule: Secondary | ICD-10-CM | POA: Diagnosis not present

## 2022-03-18 DIAGNOSIS — Z923 Personal history of irradiation: Secondary | ICD-10-CM | POA: Diagnosis not present

## 2022-03-19 DIAGNOSIS — R911 Solitary pulmonary nodule: Secondary | ICD-10-CM | POA: Diagnosis not present

## 2022-03-19 DIAGNOSIS — Z794 Long term (current) use of insulin: Secondary | ICD-10-CM | POA: Diagnosis not present

## 2022-03-19 DIAGNOSIS — J3801 Paralysis of vocal cords and larynx, unilateral: Secondary | ICD-10-CM | POA: Diagnosis not present

## 2022-03-19 DIAGNOSIS — C73 Malignant neoplasm of thyroid gland: Secondary | ICD-10-CM | POA: Diagnosis not present

## 2022-03-19 DIAGNOSIS — Z85850 Personal history of malignant neoplasm of thyroid: Secondary | ICD-10-CM | POA: Diagnosis not present

## 2022-03-19 DIAGNOSIS — E119 Type 2 diabetes mellitus without complications: Secondary | ICD-10-CM | POA: Diagnosis not present

## 2022-03-21 DIAGNOSIS — Z483 Aftercare following surgery for neoplasm: Secondary | ICD-10-CM | POA: Diagnosis not present

## 2022-03-21 DIAGNOSIS — Z79890 Hormone replacement therapy (postmenopausal): Secondary | ICD-10-CM | POA: Diagnosis not present

## 2022-03-21 DIAGNOSIS — C73 Malignant neoplasm of thyroid gland: Secondary | ICD-10-CM | POA: Diagnosis not present

## 2022-04-16 DIAGNOSIS — Z9089 Acquired absence of other organs: Secondary | ICD-10-CM | POA: Diagnosis not present

## 2022-04-16 DIAGNOSIS — Z9889 Other specified postprocedural states: Secondary | ICD-10-CM | POA: Diagnosis not present

## 2022-04-16 DIAGNOSIS — C73 Malignant neoplasm of thyroid gland: Secondary | ICD-10-CM | POA: Diagnosis not present

## 2022-04-16 DIAGNOSIS — R911 Solitary pulmonary nodule: Secondary | ICD-10-CM | POA: Diagnosis not present

## 2022-04-26 DIAGNOSIS — Z1231 Encounter for screening mammogram for malignant neoplasm of breast: Secondary | ICD-10-CM | POA: Diagnosis not present

## 2022-05-21 DIAGNOSIS — E785 Hyperlipidemia, unspecified: Secondary | ICD-10-CM | POA: Diagnosis not present

## 2022-05-21 DIAGNOSIS — R760 Raised antibody titer: Secondary | ICD-10-CM | POA: Diagnosis not present

## 2022-05-21 DIAGNOSIS — Z794 Long term (current) use of insulin: Secondary | ICD-10-CM | POA: Diagnosis not present

## 2022-05-21 DIAGNOSIS — E1040 Type 1 diabetes mellitus with diabetic neuropathy, unspecified: Secondary | ICD-10-CM | POA: Diagnosis not present

## 2022-05-21 DIAGNOSIS — M8580 Other specified disorders of bone density and structure, unspecified site: Secondary | ICD-10-CM | POA: Diagnosis not present

## 2022-05-21 DIAGNOSIS — E039 Hypothyroidism, unspecified: Secondary | ICD-10-CM | POA: Diagnosis not present

## 2022-05-21 DIAGNOSIS — M79641 Pain in right hand: Secondary | ICD-10-CM | POA: Diagnosis not present

## 2022-05-21 DIAGNOSIS — I7300 Raynaud's syndrome without gangrene: Secondary | ICD-10-CM | POA: Diagnosis not present

## 2022-05-21 DIAGNOSIS — E559 Vitamin D deficiency, unspecified: Secondary | ICD-10-CM | POA: Diagnosis not present

## 2022-05-21 DIAGNOSIS — R490 Dysphonia: Secondary | ICD-10-CM | POA: Diagnosis not present

## 2022-05-21 DIAGNOSIS — C73 Malignant neoplasm of thyroid gland: Secondary | ICD-10-CM | POA: Diagnosis not present

## 2022-05-21 DIAGNOSIS — M25519 Pain in unspecified shoulder: Secondary | ICD-10-CM | POA: Diagnosis not present

## 2022-05-27 DIAGNOSIS — E890 Postprocedural hypothyroidism: Secondary | ICD-10-CM | POA: Diagnosis not present

## 2022-05-27 DIAGNOSIS — C73 Malignant neoplasm of thyroid gland: Secondary | ICD-10-CM | POA: Diagnosis not present

## 2022-05-28 DIAGNOSIS — C73 Malignant neoplasm of thyroid gland: Secondary | ICD-10-CM | POA: Diagnosis not present

## 2022-05-30 DIAGNOSIS — C73 Malignant neoplasm of thyroid gland: Secondary | ICD-10-CM | POA: Diagnosis not present

## 2022-05-30 DIAGNOSIS — E890 Postprocedural hypothyroidism: Secondary | ICD-10-CM | POA: Diagnosis not present

## 2022-06-03 ENCOUNTER — Ambulatory Visit: Payer: Medicare Other | Admitting: Cardiology

## 2022-06-04 DIAGNOSIS — Z85850 Personal history of malignant neoplasm of thyroid: Secondary | ICD-10-CM | POA: Diagnosis not present

## 2022-06-04 DIAGNOSIS — H6121 Impacted cerumen, right ear: Secondary | ICD-10-CM | POA: Diagnosis not present

## 2022-06-04 DIAGNOSIS — J3801 Paralysis of vocal cords and larynx, unilateral: Secondary | ICD-10-CM | POA: Diagnosis not present

## 2022-06-04 DIAGNOSIS — R490 Dysphonia: Secondary | ICD-10-CM | POA: Diagnosis not present

## 2022-06-04 DIAGNOSIS — J383 Other diseases of vocal cords: Secondary | ICD-10-CM | POA: Diagnosis not present

## 2022-06-04 DIAGNOSIS — Z9889 Other specified postprocedural states: Secondary | ICD-10-CM | POA: Diagnosis not present

## 2022-06-27 DIAGNOSIS — E890 Postprocedural hypothyroidism: Secondary | ICD-10-CM | POA: Diagnosis not present

## 2022-07-01 DIAGNOSIS — Z23 Encounter for immunization: Secondary | ICD-10-CM | POA: Diagnosis not present

## 2022-07-09 DIAGNOSIS — R932 Abnormal findings on diagnostic imaging of liver and biliary tract: Secondary | ICD-10-CM | POA: Diagnosis not present

## 2022-07-09 DIAGNOSIS — C73 Malignant neoplasm of thyroid gland: Secondary | ICD-10-CM | POA: Diagnosis not present

## 2022-07-18 DIAGNOSIS — C73 Malignant neoplasm of thyroid gland: Secondary | ICD-10-CM | POA: Diagnosis not present

## 2022-07-19 DIAGNOSIS — Z01419 Encounter for gynecological examination (general) (routine) without abnormal findings: Secondary | ICD-10-CM | POA: Diagnosis not present

## 2022-07-26 DIAGNOSIS — E119 Type 2 diabetes mellitus without complications: Secondary | ICD-10-CM | POA: Diagnosis not present

## 2022-08-07 DIAGNOSIS — C73 Malignant neoplasm of thyroid gland: Secondary | ICD-10-CM | POA: Diagnosis not present

## 2022-08-14 DIAGNOSIS — R1312 Dysphagia, oropharyngeal phase: Secondary | ICD-10-CM | POA: Diagnosis not present

## 2022-08-14 DIAGNOSIS — C73 Malignant neoplasm of thyroid gland: Secondary | ICD-10-CM | POA: Diagnosis not present

## 2022-08-19 DIAGNOSIS — C73 Malignant neoplasm of thyroid gland: Secondary | ICD-10-CM | POA: Diagnosis not present

## 2022-08-19 DIAGNOSIS — E890 Postprocedural hypothyroidism: Secondary | ICD-10-CM | POA: Diagnosis not present

## 2022-08-21 DIAGNOSIS — C73 Malignant neoplasm of thyroid gland: Secondary | ICD-10-CM | POA: Diagnosis not present

## 2022-08-21 DIAGNOSIS — E890 Postprocedural hypothyroidism: Secondary | ICD-10-CM | POA: Diagnosis not present

## 2022-08-21 DIAGNOSIS — Z510 Encounter for antineoplastic radiation therapy: Secondary | ICD-10-CM | POA: Diagnosis not present

## 2022-08-22 DIAGNOSIS — C73 Malignant neoplasm of thyroid gland: Secondary | ICD-10-CM | POA: Diagnosis not present

## 2022-08-30 DIAGNOSIS — C73 Malignant neoplasm of thyroid gland: Secondary | ICD-10-CM | POA: Diagnosis not present

## 2022-08-30 DIAGNOSIS — Z510 Encounter for antineoplastic radiation therapy: Secondary | ICD-10-CM | POA: Diagnosis not present

## 2022-08-30 DIAGNOSIS — E890 Postprocedural hypothyroidism: Secondary | ICD-10-CM | POA: Diagnosis not present

## 2022-09-04 DIAGNOSIS — C73 Malignant neoplasm of thyroid gland: Secondary | ICD-10-CM | POA: Diagnosis not present

## 2022-09-04 DIAGNOSIS — Z510 Encounter for antineoplastic radiation therapy: Secondary | ICD-10-CM | POA: Diagnosis not present

## 2022-09-04 DIAGNOSIS — E890 Postprocedural hypothyroidism: Secondary | ICD-10-CM | POA: Diagnosis not present

## 2022-09-05 DIAGNOSIS — E890 Postprocedural hypothyroidism: Secondary | ICD-10-CM | POA: Diagnosis not present

## 2022-09-05 DIAGNOSIS — Z510 Encounter for antineoplastic radiation therapy: Secondary | ICD-10-CM | POA: Diagnosis not present

## 2022-09-05 DIAGNOSIS — C73 Malignant neoplasm of thyroid gland: Secondary | ICD-10-CM | POA: Diagnosis not present

## 2022-09-06 DIAGNOSIS — E890 Postprocedural hypothyroidism: Secondary | ICD-10-CM | POA: Diagnosis not present

## 2022-09-06 DIAGNOSIS — Z510 Encounter for antineoplastic radiation therapy: Secondary | ICD-10-CM | POA: Diagnosis not present

## 2022-09-06 DIAGNOSIS — C73 Malignant neoplasm of thyroid gland: Secondary | ICD-10-CM | POA: Diagnosis not present

## 2022-09-09 DIAGNOSIS — R1312 Dysphagia, oropharyngeal phase: Secondary | ICD-10-CM | POA: Diagnosis not present

## 2022-09-09 DIAGNOSIS — Z510 Encounter for antineoplastic radiation therapy: Secondary | ICD-10-CM | POA: Diagnosis not present

## 2022-09-09 DIAGNOSIS — C73 Malignant neoplasm of thyroid gland: Secondary | ICD-10-CM | POA: Diagnosis not present

## 2022-09-09 DIAGNOSIS — E890 Postprocedural hypothyroidism: Secondary | ICD-10-CM | POA: Diagnosis not present

## 2022-09-10 DIAGNOSIS — E890 Postprocedural hypothyroidism: Secondary | ICD-10-CM | POA: Diagnosis not present

## 2022-09-10 DIAGNOSIS — C73 Malignant neoplasm of thyroid gland: Secondary | ICD-10-CM | POA: Diagnosis not present

## 2022-09-10 DIAGNOSIS — Z510 Encounter for antineoplastic radiation therapy: Secondary | ICD-10-CM | POA: Diagnosis not present

## 2022-09-11 DIAGNOSIS — C73 Malignant neoplasm of thyroid gland: Secondary | ICD-10-CM | POA: Diagnosis not present

## 2022-09-11 DIAGNOSIS — E890 Postprocedural hypothyroidism: Secondary | ICD-10-CM | POA: Diagnosis not present

## 2022-09-11 DIAGNOSIS — Z510 Encounter for antineoplastic radiation therapy: Secondary | ICD-10-CM | POA: Diagnosis not present

## 2022-09-12 DIAGNOSIS — M25511 Pain in right shoulder: Secondary | ICD-10-CM | POA: Diagnosis not present

## 2022-09-12 DIAGNOSIS — C73 Malignant neoplasm of thyroid gland: Secondary | ICD-10-CM | POA: Diagnosis not present

## 2022-09-12 DIAGNOSIS — Z510 Encounter for antineoplastic radiation therapy: Secondary | ICD-10-CM | POA: Diagnosis not present

## 2022-09-12 DIAGNOSIS — E890 Postprocedural hypothyroidism: Secondary | ICD-10-CM | POA: Diagnosis not present

## 2022-09-12 DIAGNOSIS — G8929 Other chronic pain: Secondary | ICD-10-CM | POA: Diagnosis not present

## 2022-09-13 DIAGNOSIS — C73 Malignant neoplasm of thyroid gland: Secondary | ICD-10-CM | POA: Diagnosis not present

## 2022-09-13 DIAGNOSIS — Z510 Encounter for antineoplastic radiation therapy: Secondary | ICD-10-CM | POA: Diagnosis not present

## 2022-09-13 DIAGNOSIS — E890 Postprocedural hypothyroidism: Secondary | ICD-10-CM | POA: Diagnosis not present

## 2022-09-16 DIAGNOSIS — C73 Malignant neoplasm of thyroid gland: Secondary | ICD-10-CM | POA: Diagnosis not present

## 2022-09-16 DIAGNOSIS — Z510 Encounter for antineoplastic radiation therapy: Secondary | ICD-10-CM | POA: Diagnosis not present

## 2022-09-16 DIAGNOSIS — E890 Postprocedural hypothyroidism: Secondary | ICD-10-CM | POA: Diagnosis not present

## 2022-09-16 DIAGNOSIS — R1312 Dysphagia, oropharyngeal phase: Secondary | ICD-10-CM | POA: Diagnosis not present

## 2022-09-17 DIAGNOSIS — R490 Dysphonia: Secondary | ICD-10-CM | POA: Diagnosis not present

## 2022-09-17 DIAGNOSIS — E039 Hypothyroidism, unspecified: Secondary | ICD-10-CM | POA: Diagnosis not present

## 2022-09-17 DIAGNOSIS — E109 Type 1 diabetes mellitus without complications: Secondary | ICD-10-CM | POA: Diagnosis not present

## 2022-09-17 DIAGNOSIS — E785 Hyperlipidemia, unspecified: Secondary | ICD-10-CM | POA: Diagnosis not present

## 2022-09-17 DIAGNOSIS — R760 Raised antibody titer: Secondary | ICD-10-CM | POA: Diagnosis not present

## 2022-09-17 DIAGNOSIS — R0789 Other chest pain: Secondary | ICD-10-CM | POA: Diagnosis not present

## 2022-09-17 DIAGNOSIS — M8580 Other specified disorders of bone density and structure, unspecified site: Secondary | ICD-10-CM | POA: Diagnosis not present

## 2022-09-17 DIAGNOSIS — E890 Postprocedural hypothyroidism: Secondary | ICD-10-CM | POA: Diagnosis not present

## 2022-09-17 DIAGNOSIS — M25519 Pain in unspecified shoulder: Secondary | ICD-10-CM | POA: Diagnosis not present

## 2022-09-17 DIAGNOSIS — Z794 Long term (current) use of insulin: Secondary | ICD-10-CM | POA: Diagnosis not present

## 2022-09-17 DIAGNOSIS — M79641 Pain in right hand: Secondary | ICD-10-CM | POA: Diagnosis not present

## 2022-09-17 DIAGNOSIS — C73 Malignant neoplasm of thyroid gland: Secondary | ICD-10-CM | POA: Diagnosis not present

## 2022-09-17 DIAGNOSIS — Z510 Encounter for antineoplastic radiation therapy: Secondary | ICD-10-CM | POA: Diagnosis not present

## 2022-09-17 DIAGNOSIS — E559 Vitamin D deficiency, unspecified: Secondary | ICD-10-CM | POA: Diagnosis not present

## 2022-09-18 DIAGNOSIS — E890 Postprocedural hypothyroidism: Secondary | ICD-10-CM | POA: Diagnosis not present

## 2022-09-18 DIAGNOSIS — Z510 Encounter for antineoplastic radiation therapy: Secondary | ICD-10-CM | POA: Diagnosis not present

## 2022-09-18 DIAGNOSIS — C73 Malignant neoplasm of thyroid gland: Secondary | ICD-10-CM | POA: Diagnosis not present

## 2022-09-19 DIAGNOSIS — E890 Postprocedural hypothyroidism: Secondary | ICD-10-CM | POA: Diagnosis not present

## 2022-09-19 DIAGNOSIS — Z510 Encounter for antineoplastic radiation therapy: Secondary | ICD-10-CM | POA: Diagnosis not present

## 2022-09-19 DIAGNOSIS — C73 Malignant neoplasm of thyroid gland: Secondary | ICD-10-CM | POA: Diagnosis not present

## 2022-09-20 DIAGNOSIS — E890 Postprocedural hypothyroidism: Secondary | ICD-10-CM | POA: Diagnosis not present

## 2022-09-20 DIAGNOSIS — C73 Malignant neoplasm of thyroid gland: Secondary | ICD-10-CM | POA: Diagnosis not present

## 2022-09-20 DIAGNOSIS — Z510 Encounter for antineoplastic radiation therapy: Secondary | ICD-10-CM | POA: Diagnosis not present

## 2022-09-23 DIAGNOSIS — E890 Postprocedural hypothyroidism: Secondary | ICD-10-CM | POA: Diagnosis not present

## 2022-09-23 DIAGNOSIS — C73 Malignant neoplasm of thyroid gland: Secondary | ICD-10-CM | POA: Diagnosis not present

## 2022-09-23 DIAGNOSIS — Z510 Encounter for antineoplastic radiation therapy: Secondary | ICD-10-CM | POA: Diagnosis not present

## 2022-09-23 DIAGNOSIS — R1312 Dysphagia, oropharyngeal phase: Secondary | ICD-10-CM | POA: Diagnosis not present

## 2022-09-24 DIAGNOSIS — E890 Postprocedural hypothyroidism: Secondary | ICD-10-CM | POA: Diagnosis not present

## 2022-09-24 DIAGNOSIS — C73 Malignant neoplasm of thyroid gland: Secondary | ICD-10-CM | POA: Diagnosis not present

## 2022-09-24 DIAGNOSIS — Z510 Encounter for antineoplastic radiation therapy: Secondary | ICD-10-CM | POA: Diagnosis not present

## 2022-09-25 DIAGNOSIS — C73 Malignant neoplasm of thyroid gland: Secondary | ICD-10-CM | POA: Diagnosis not present

## 2022-09-25 DIAGNOSIS — E890 Postprocedural hypothyroidism: Secondary | ICD-10-CM | POA: Diagnosis not present

## 2022-09-25 DIAGNOSIS — Z510 Encounter for antineoplastic radiation therapy: Secondary | ICD-10-CM | POA: Diagnosis not present

## 2022-09-26 DIAGNOSIS — E890 Postprocedural hypothyroidism: Secondary | ICD-10-CM | POA: Diagnosis not present

## 2022-09-26 DIAGNOSIS — C73 Malignant neoplasm of thyroid gland: Secondary | ICD-10-CM | POA: Diagnosis not present

## 2022-09-26 DIAGNOSIS — Z510 Encounter for antineoplastic radiation therapy: Secondary | ICD-10-CM | POA: Diagnosis not present

## 2022-09-27 DIAGNOSIS — Z510 Encounter for antineoplastic radiation therapy: Secondary | ICD-10-CM | POA: Diagnosis not present

## 2022-09-27 DIAGNOSIS — C73 Malignant neoplasm of thyroid gland: Secondary | ICD-10-CM | POA: Diagnosis not present

## 2022-09-27 DIAGNOSIS — E890 Postprocedural hypothyroidism: Secondary | ICD-10-CM | POA: Diagnosis not present

## 2022-10-01 DIAGNOSIS — C73 Malignant neoplasm of thyroid gland: Secondary | ICD-10-CM | POA: Diagnosis not present

## 2022-10-01 DIAGNOSIS — Z510 Encounter for antineoplastic radiation therapy: Secondary | ICD-10-CM | POA: Diagnosis not present

## 2022-10-01 DIAGNOSIS — E890 Postprocedural hypothyroidism: Secondary | ICD-10-CM | POA: Diagnosis not present

## 2022-10-02 DIAGNOSIS — C73 Malignant neoplasm of thyroid gland: Secondary | ICD-10-CM | POA: Diagnosis not present

## 2022-10-02 DIAGNOSIS — E890 Postprocedural hypothyroidism: Secondary | ICD-10-CM | POA: Diagnosis not present

## 2022-10-02 DIAGNOSIS — Z510 Encounter for antineoplastic radiation therapy: Secondary | ICD-10-CM | POA: Diagnosis not present

## 2022-10-03 DIAGNOSIS — Z510 Encounter for antineoplastic radiation therapy: Secondary | ICD-10-CM | POA: Diagnosis not present

## 2022-10-03 DIAGNOSIS — E890 Postprocedural hypothyroidism: Secondary | ICD-10-CM | POA: Diagnosis not present

## 2022-10-03 DIAGNOSIS — C73 Malignant neoplasm of thyroid gland: Secondary | ICD-10-CM | POA: Diagnosis not present

## 2022-10-04 DIAGNOSIS — E890 Postprocedural hypothyroidism: Secondary | ICD-10-CM | POA: Diagnosis not present

## 2022-10-04 DIAGNOSIS — C73 Malignant neoplasm of thyroid gland: Secondary | ICD-10-CM | POA: Diagnosis not present

## 2022-10-04 DIAGNOSIS — Z510 Encounter for antineoplastic radiation therapy: Secondary | ICD-10-CM | POA: Diagnosis not present

## 2022-10-08 DIAGNOSIS — R600 Localized edema: Secondary | ICD-10-CM | POA: Diagnosis not present

## 2022-10-08 DIAGNOSIS — C73 Malignant neoplasm of thyroid gland: Secondary | ICD-10-CM | POA: Diagnosis not present

## 2022-10-08 DIAGNOSIS — Z510 Encounter for antineoplastic radiation therapy: Secondary | ICD-10-CM | POA: Diagnosis not present

## 2022-10-08 DIAGNOSIS — E890 Postprocedural hypothyroidism: Secondary | ICD-10-CM | POA: Diagnosis not present

## 2022-10-09 DIAGNOSIS — R600 Localized edema: Secondary | ICD-10-CM | POA: Diagnosis not present

## 2022-10-09 DIAGNOSIS — C73 Malignant neoplasm of thyroid gland: Secondary | ICD-10-CM | POA: Diagnosis not present

## 2022-10-09 DIAGNOSIS — Z510 Encounter for antineoplastic radiation therapy: Secondary | ICD-10-CM | POA: Diagnosis not present

## 2022-10-09 DIAGNOSIS — E890 Postprocedural hypothyroidism: Secondary | ICD-10-CM | POA: Diagnosis not present

## 2022-10-10 DIAGNOSIS — E890 Postprocedural hypothyroidism: Secondary | ICD-10-CM | POA: Diagnosis not present

## 2022-10-10 DIAGNOSIS — R600 Localized edema: Secondary | ICD-10-CM | POA: Diagnosis not present

## 2022-10-10 DIAGNOSIS — Z510 Encounter for antineoplastic radiation therapy: Secondary | ICD-10-CM | POA: Diagnosis not present

## 2022-10-10 DIAGNOSIS — C73 Malignant neoplasm of thyroid gland: Secondary | ICD-10-CM | POA: Diagnosis not present

## 2022-10-11 DIAGNOSIS — Z510 Encounter for antineoplastic radiation therapy: Secondary | ICD-10-CM | POA: Diagnosis not present

## 2022-10-11 DIAGNOSIS — C73 Malignant neoplasm of thyroid gland: Secondary | ICD-10-CM | POA: Diagnosis not present

## 2022-10-11 DIAGNOSIS — E890 Postprocedural hypothyroidism: Secondary | ICD-10-CM | POA: Diagnosis not present

## 2022-10-11 DIAGNOSIS — R600 Localized edema: Secondary | ICD-10-CM | POA: Diagnosis not present

## 2022-10-14 DIAGNOSIS — C73 Malignant neoplasm of thyroid gland: Secondary | ICD-10-CM | POA: Diagnosis not present

## 2022-10-14 DIAGNOSIS — R600 Localized edema: Secondary | ICD-10-CM | POA: Diagnosis not present

## 2022-10-14 DIAGNOSIS — Z510 Encounter for antineoplastic radiation therapy: Secondary | ICD-10-CM | POA: Diagnosis not present

## 2022-10-14 DIAGNOSIS — E890 Postprocedural hypothyroidism: Secondary | ICD-10-CM | POA: Diagnosis not present

## 2022-10-14 DIAGNOSIS — R1312 Dysphagia, oropharyngeal phase: Secondary | ICD-10-CM | POA: Diagnosis not present

## 2022-10-15 DIAGNOSIS — R600 Localized edema: Secondary | ICD-10-CM | POA: Diagnosis not present

## 2022-10-15 DIAGNOSIS — Z510 Encounter for antineoplastic radiation therapy: Secondary | ICD-10-CM | POA: Diagnosis not present

## 2022-10-15 DIAGNOSIS — C73 Malignant neoplasm of thyroid gland: Secondary | ICD-10-CM | POA: Diagnosis not present

## 2022-10-15 DIAGNOSIS — E890 Postprocedural hypothyroidism: Secondary | ICD-10-CM | POA: Diagnosis not present

## 2022-10-16 DIAGNOSIS — Z510 Encounter for antineoplastic radiation therapy: Secondary | ICD-10-CM | POA: Diagnosis not present

## 2022-10-16 DIAGNOSIS — C73 Malignant neoplasm of thyroid gland: Secondary | ICD-10-CM | POA: Diagnosis not present

## 2022-10-16 DIAGNOSIS — R600 Localized edema: Secondary | ICD-10-CM | POA: Diagnosis not present

## 2022-10-16 DIAGNOSIS — E890 Postprocedural hypothyroidism: Secondary | ICD-10-CM | POA: Diagnosis not present

## 2022-10-17 DIAGNOSIS — Z510 Encounter for antineoplastic radiation therapy: Secondary | ICD-10-CM | POA: Diagnosis not present

## 2022-10-17 DIAGNOSIS — R600 Localized edema: Secondary | ICD-10-CM | POA: Diagnosis not present

## 2022-10-17 DIAGNOSIS — C73 Malignant neoplasm of thyroid gland: Secondary | ICD-10-CM | POA: Diagnosis not present

## 2022-10-17 DIAGNOSIS — E890 Postprocedural hypothyroidism: Secondary | ICD-10-CM | POA: Diagnosis not present

## 2022-10-18 DIAGNOSIS — Z510 Encounter for antineoplastic radiation therapy: Secondary | ICD-10-CM | POA: Diagnosis not present

## 2022-10-18 DIAGNOSIS — E890 Postprocedural hypothyroidism: Secondary | ICD-10-CM | POA: Diagnosis not present

## 2022-10-18 DIAGNOSIS — C73 Malignant neoplasm of thyroid gland: Secondary | ICD-10-CM | POA: Diagnosis not present

## 2022-10-18 DIAGNOSIS — R600 Localized edema: Secondary | ICD-10-CM | POA: Diagnosis not present

## 2022-10-20 DIAGNOSIS — R300 Dysuria: Secondary | ICD-10-CM | POA: Diagnosis not present

## 2022-10-20 DIAGNOSIS — N3001 Acute cystitis with hematuria: Secondary | ICD-10-CM | POA: Diagnosis not present

## 2022-10-22 DIAGNOSIS — E890 Postprocedural hypothyroidism: Secondary | ICD-10-CM | POA: Diagnosis not present

## 2022-10-22 DIAGNOSIS — R600 Localized edema: Secondary | ICD-10-CM | POA: Diagnosis not present

## 2022-10-22 DIAGNOSIS — Z510 Encounter for antineoplastic radiation therapy: Secondary | ICD-10-CM | POA: Diagnosis not present

## 2022-10-22 DIAGNOSIS — C73 Malignant neoplasm of thyroid gland: Secondary | ICD-10-CM | POA: Diagnosis not present

## 2022-10-23 DIAGNOSIS — C73 Malignant neoplasm of thyroid gland: Secondary | ICD-10-CM | POA: Diagnosis not present

## 2022-10-23 DIAGNOSIS — Z510 Encounter for antineoplastic radiation therapy: Secondary | ICD-10-CM | POA: Diagnosis not present

## 2022-10-23 DIAGNOSIS — R600 Localized edema: Secondary | ICD-10-CM | POA: Diagnosis not present

## 2022-10-23 DIAGNOSIS — E890 Postprocedural hypothyroidism: Secondary | ICD-10-CM | POA: Diagnosis not present

## 2022-10-24 DIAGNOSIS — R911 Solitary pulmonary nodule: Secondary | ICD-10-CM | POA: Diagnosis not present

## 2022-10-24 DIAGNOSIS — E890 Postprocedural hypothyroidism: Secondary | ICD-10-CM | POA: Diagnosis not present

## 2022-10-24 DIAGNOSIS — R490 Dysphonia: Secondary | ICD-10-CM | POA: Diagnosis not present

## 2022-10-24 DIAGNOSIS — J3800 Paralysis of vocal cords and larynx, unspecified: Secondary | ICD-10-CM | POA: Diagnosis not present

## 2022-10-24 DIAGNOSIS — C73 Malignant neoplasm of thyroid gland: Secondary | ICD-10-CM | POA: Diagnosis not present

## 2022-10-24 DIAGNOSIS — Z923 Personal history of irradiation: Secondary | ICD-10-CM | POA: Diagnosis not present

## 2022-10-24 DIAGNOSIS — J3801 Paralysis of vocal cords and larynx, unilateral: Secondary | ICD-10-CM | POA: Diagnosis not present

## 2022-11-04 DIAGNOSIS — R300 Dysuria: Secondary | ICD-10-CM | POA: Diagnosis not present

## 2022-11-07 DIAGNOSIS — Z79899 Other long term (current) drug therapy: Secondary | ICD-10-CM | POA: Diagnosis not present

## 2022-11-07 DIAGNOSIS — N952 Postmenopausal atrophic vaginitis: Secondary | ICD-10-CM | POA: Diagnosis not present

## 2022-11-07 DIAGNOSIS — R102 Pelvic and perineal pain: Secondary | ICD-10-CM | POA: Diagnosis not present

## 2022-11-07 DIAGNOSIS — R300 Dysuria: Secondary | ICD-10-CM | POA: Diagnosis not present

## 2022-11-07 DIAGNOSIS — N8111 Cystocele, midline: Secondary | ICD-10-CM | POA: Diagnosis not present

## 2022-11-28 DIAGNOSIS — C73 Malignant neoplasm of thyroid gland: Secondary | ICD-10-CM | POA: Diagnosis not present

## 2022-11-28 DIAGNOSIS — R911 Solitary pulmonary nodule: Secondary | ICD-10-CM | POA: Diagnosis not present

## 2022-11-28 DIAGNOSIS — R1312 Dysphagia, oropharyngeal phase: Secondary | ICD-10-CM | POA: Diagnosis not present

## 2022-11-28 DIAGNOSIS — J3801 Paralysis of vocal cords and larynx, unilateral: Secondary | ICD-10-CM | POA: Diagnosis not present

## 2022-12-05 DIAGNOSIS — R300 Dysuria: Secondary | ICD-10-CM | POA: Diagnosis not present

## 2022-12-05 DIAGNOSIS — R102 Pelvic and perineal pain: Secondary | ICD-10-CM | POA: Diagnosis not present

## 2022-12-05 DIAGNOSIS — N8111 Cystocele, midline: Secondary | ICD-10-CM | POA: Diagnosis not present

## 2022-12-05 DIAGNOSIS — N952 Postmenopausal atrophic vaginitis: Secondary | ICD-10-CM | POA: Diagnosis not present

## 2023-01-16 DIAGNOSIS — E559 Vitamin D deficiency, unspecified: Secondary | ICD-10-CM | POA: Diagnosis not present

## 2023-01-16 DIAGNOSIS — E785 Hyperlipidemia, unspecified: Secondary | ICD-10-CM | POA: Diagnosis not present

## 2023-01-16 DIAGNOSIS — Z1212 Encounter for screening for malignant neoplasm of rectum: Secondary | ICD-10-CM | POA: Diagnosis not present

## 2023-01-16 DIAGNOSIS — E109 Type 1 diabetes mellitus without complications: Secondary | ICD-10-CM | POA: Diagnosis not present

## 2023-01-16 DIAGNOSIS — R7989 Other specified abnormal findings of blood chemistry: Secondary | ICD-10-CM | POA: Diagnosis not present

## 2023-01-16 DIAGNOSIS — E049 Nontoxic goiter, unspecified: Secondary | ICD-10-CM | POA: Diagnosis not present

## 2023-01-21 DIAGNOSIS — M25519 Pain in unspecified shoulder: Secondary | ICD-10-CM | POA: Diagnosis not present

## 2023-01-21 DIAGNOSIS — C73 Malignant neoplasm of thyroid gland: Secondary | ICD-10-CM | POA: Diagnosis not present

## 2023-01-21 DIAGNOSIS — Z1389 Encounter for screening for other disorder: Secondary | ICD-10-CM | POA: Diagnosis not present

## 2023-01-21 DIAGNOSIS — N3090 Cystitis, unspecified without hematuria: Secondary | ICD-10-CM | POA: Diagnosis not present

## 2023-01-21 DIAGNOSIS — E1040 Type 1 diabetes mellitus with diabetic neuropathy, unspecified: Secondary | ICD-10-CM | POA: Diagnosis not present

## 2023-01-21 DIAGNOSIS — M8580 Other specified disorders of bone density and structure, unspecified site: Secondary | ICD-10-CM | POA: Diagnosis not present

## 2023-01-21 DIAGNOSIS — R490 Dysphonia: Secondary | ICD-10-CM | POA: Diagnosis not present

## 2023-01-21 DIAGNOSIS — R760 Raised antibody titer: Secondary | ICD-10-CM | POA: Diagnosis not present

## 2023-01-21 DIAGNOSIS — Z1331 Encounter for screening for depression: Secondary | ICD-10-CM | POA: Diagnosis not present

## 2023-01-21 DIAGNOSIS — Z794 Long term (current) use of insulin: Secondary | ICD-10-CM | POA: Diagnosis not present

## 2023-01-21 DIAGNOSIS — R82998 Other abnormal findings in urine: Secondary | ICD-10-CM | POA: Diagnosis not present

## 2023-01-21 DIAGNOSIS — E559 Vitamin D deficiency, unspecified: Secondary | ICD-10-CM | POA: Diagnosis not present

## 2023-01-21 DIAGNOSIS — Z0000 Encounter for general adult medical examination without abnormal findings: Secondary | ICD-10-CM | POA: Diagnosis not present

## 2023-01-21 DIAGNOSIS — E039 Hypothyroidism, unspecified: Secondary | ICD-10-CM | POA: Diagnosis not present

## 2023-01-21 DIAGNOSIS — Z1339 Encounter for screening examination for other mental health and behavioral disorders: Secondary | ICD-10-CM | POA: Diagnosis not present

## 2023-01-21 DIAGNOSIS — E109 Type 1 diabetes mellitus without complications: Secondary | ICD-10-CM | POA: Diagnosis not present

## 2023-01-21 DIAGNOSIS — M79641 Pain in right hand: Secondary | ICD-10-CM | POA: Diagnosis not present

## 2023-01-21 DIAGNOSIS — E785 Hyperlipidemia, unspecified: Secondary | ICD-10-CM | POA: Diagnosis not present

## 2023-01-31 DIAGNOSIS — C73 Malignant neoplasm of thyroid gland: Secondary | ICD-10-CM | POA: Diagnosis not present

## 2023-01-31 DIAGNOSIS — Z9889 Other specified postprocedural states: Secondary | ICD-10-CM | POA: Diagnosis not present

## 2023-01-31 DIAGNOSIS — Z85850 Personal history of malignant neoplasm of thyroid: Secondary | ICD-10-CM | POA: Diagnosis not present

## 2023-01-31 DIAGNOSIS — Z79899 Other long term (current) drug therapy: Secondary | ICD-10-CM | POA: Diagnosis not present

## 2023-01-31 DIAGNOSIS — Z923 Personal history of irradiation: Secondary | ICD-10-CM | POA: Diagnosis not present

## 2023-01-31 DIAGNOSIS — Z9089 Acquired absence of other organs: Secondary | ICD-10-CM | POA: Diagnosis not present

## 2023-01-31 DIAGNOSIS — R918 Other nonspecific abnormal finding of lung field: Secondary | ICD-10-CM | POA: Diagnosis not present

## 2023-02-18 DIAGNOSIS — C73 Malignant neoplasm of thyroid gland: Secondary | ICD-10-CM | POA: Diagnosis not present

## 2023-02-18 DIAGNOSIS — E890 Postprocedural hypothyroidism: Secondary | ICD-10-CM | POA: Diagnosis not present

## 2023-02-27 DIAGNOSIS — C73 Malignant neoplasm of thyroid gland: Secondary | ICD-10-CM | POA: Diagnosis not present

## 2023-03-27 DIAGNOSIS — M25511 Pain in right shoulder: Secondary | ICD-10-CM | POA: Diagnosis not present

## 2023-03-27 DIAGNOSIS — G8929 Other chronic pain: Secondary | ICD-10-CM | POA: Diagnosis not present

## 2023-03-28 DIAGNOSIS — C73 Malignant neoplasm of thyroid gland: Secondary | ICD-10-CM | POA: Diagnosis not present

## 2023-05-01 DIAGNOSIS — R051 Acute cough: Secondary | ICD-10-CM | POA: Diagnosis not present

## 2023-05-01 DIAGNOSIS — J069 Acute upper respiratory infection, unspecified: Secondary | ICD-10-CM | POA: Diagnosis not present

## 2023-05-09 DIAGNOSIS — M8580 Other specified disorders of bone density and structure, unspecified site: Secondary | ICD-10-CM | POA: Diagnosis not present

## 2023-05-09 DIAGNOSIS — M79641 Pain in right hand: Secondary | ICD-10-CM | POA: Diagnosis not present

## 2023-05-09 DIAGNOSIS — E785 Hyperlipidemia, unspecified: Secondary | ICD-10-CM | POA: Diagnosis not present

## 2023-05-09 DIAGNOSIS — Z794 Long term (current) use of insulin: Secondary | ICD-10-CM | POA: Diagnosis not present

## 2023-05-09 DIAGNOSIS — N3090 Cystitis, unspecified without hematuria: Secondary | ICD-10-CM | POA: Diagnosis not present

## 2023-05-09 DIAGNOSIS — R490 Dysphonia: Secondary | ICD-10-CM | POA: Diagnosis not present

## 2023-05-09 DIAGNOSIS — I7300 Raynaud's syndrome without gangrene: Secondary | ICD-10-CM | POA: Diagnosis not present

## 2023-05-09 DIAGNOSIS — E039 Hypothyroidism, unspecified: Secondary | ICD-10-CM | POA: Diagnosis not present

## 2023-05-09 DIAGNOSIS — C73 Malignant neoplasm of thyroid gland: Secondary | ICD-10-CM | POA: Diagnosis not present

## 2023-05-09 DIAGNOSIS — E559 Vitamin D deficiency, unspecified: Secondary | ICD-10-CM | POA: Diagnosis not present

## 2023-05-09 DIAGNOSIS — R760 Raised antibody titer: Secondary | ICD-10-CM | POA: Diagnosis not present

## 2023-05-09 DIAGNOSIS — E109 Type 1 diabetes mellitus without complications: Secondary | ICD-10-CM | POA: Diagnosis not present

## 2023-05-16 DIAGNOSIS — E890 Postprocedural hypothyroidism: Secondary | ICD-10-CM | POA: Diagnosis not present

## 2023-05-16 DIAGNOSIS — Z79899 Other long term (current) drug therapy: Secondary | ICD-10-CM | POA: Diagnosis not present

## 2023-05-16 DIAGNOSIS — Z85850 Personal history of malignant neoplasm of thyroid: Secondary | ICD-10-CM | POA: Diagnosis not present

## 2023-05-16 DIAGNOSIS — C73 Malignant neoplasm of thyroid gland: Secondary | ICD-10-CM | POA: Diagnosis not present

## 2023-05-27 DIAGNOSIS — C73 Malignant neoplasm of thyroid gland: Secondary | ICD-10-CM | POA: Diagnosis not present

## 2023-05-28 DIAGNOSIS — N390 Urinary tract infection, site not specified: Secondary | ICD-10-CM | POA: Diagnosis not present

## 2023-06-05 DIAGNOSIS — I890 Lymphedema, not elsewhere classified: Secondary | ICD-10-CM | POA: Diagnosis not present

## 2023-06-05 DIAGNOSIS — R1310 Dysphagia, unspecified: Secondary | ICD-10-CM | POA: Diagnosis not present

## 2023-06-05 DIAGNOSIS — R1312 Dysphagia, oropharyngeal phase: Secondary | ICD-10-CM | POA: Diagnosis not present

## 2023-06-05 DIAGNOSIS — Z85850 Personal history of malignant neoplasm of thyroid: Secondary | ICD-10-CM | POA: Diagnosis not present

## 2023-06-05 DIAGNOSIS — R059 Cough, unspecified: Secondary | ICD-10-CM | POA: Diagnosis not present

## 2023-06-05 DIAGNOSIS — C73 Malignant neoplasm of thyroid gland: Secondary | ICD-10-CM | POA: Diagnosis not present

## 2023-06-06 DIAGNOSIS — Z1231 Encounter for screening mammogram for malignant neoplasm of breast: Secondary | ICD-10-CM | POA: Diagnosis not present

## 2023-06-20 DIAGNOSIS — E785 Hyperlipidemia, unspecified: Secondary | ICD-10-CM | POA: Diagnosis present

## 2023-06-20 DIAGNOSIS — M1990 Unspecified osteoarthritis, unspecified site: Secondary | ICD-10-CM | POA: Diagnosis present

## 2023-06-20 DIAGNOSIS — B179 Acute viral hepatitis, unspecified: Secondary | ICD-10-CM | POA: Diagnosis not present

## 2023-06-20 DIAGNOSIS — Z7401 Bed confinement status: Secondary | ICD-10-CM | POA: Diagnosis not present

## 2023-06-20 DIAGNOSIS — E069 Thyroiditis, unspecified: Secondary | ICD-10-CM | POA: Diagnosis present

## 2023-06-20 DIAGNOSIS — R748 Abnormal levels of other serum enzymes: Secondary | ICD-10-CM | POA: Diagnosis not present

## 2023-06-20 DIAGNOSIS — C801 Malignant (primary) neoplasm, unspecified: Secondary | ICD-10-CM | POA: Diagnosis not present

## 2023-06-20 DIAGNOSIS — R1111 Vomiting without nausea: Secondary | ICD-10-CM | POA: Diagnosis not present

## 2023-06-20 DIAGNOSIS — Z79899 Other long term (current) drug therapy: Secondary | ICD-10-CM | POA: Diagnosis not present

## 2023-06-20 DIAGNOSIS — K7689 Other specified diseases of liver: Secondary | ICD-10-CM | POA: Diagnosis not present

## 2023-06-20 DIAGNOSIS — Z794 Long term (current) use of insulin: Secondary | ICD-10-CM | POA: Diagnosis not present

## 2023-06-20 DIAGNOSIS — R112 Nausea with vomiting, unspecified: Secondary | ICD-10-CM | POA: Diagnosis present

## 2023-06-20 DIAGNOSIS — Z9641 Presence of insulin pump (external) (internal): Secondary | ICD-10-CM | POA: Diagnosis present

## 2023-06-20 DIAGNOSIS — R531 Weakness: Secondary | ICD-10-CM | POA: Diagnosis not present

## 2023-06-20 DIAGNOSIS — Z923 Personal history of irradiation: Secondary | ICD-10-CM | POA: Diagnosis not present

## 2023-06-20 DIAGNOSIS — R932 Abnormal findings on diagnostic imaging of liver and biliary tract: Secondary | ICD-10-CM | POA: Diagnosis not present

## 2023-06-20 DIAGNOSIS — E890 Postprocedural hypothyroidism: Secondary | ICD-10-CM | POA: Diagnosis not present

## 2023-06-20 DIAGNOSIS — N281 Cyst of kidney, acquired: Secondary | ICD-10-CM | POA: Diagnosis not present

## 2023-06-20 DIAGNOSIS — S36119A Unspecified injury of liver, initial encounter: Secondary | ICD-10-CM | POA: Diagnosis not present

## 2023-06-20 DIAGNOSIS — R946 Abnormal results of thyroid function studies: Secondary | ICD-10-CM | POA: Diagnosis not present

## 2023-06-20 DIAGNOSIS — K754 Autoimmune hepatitis: Secondary | ICD-10-CM | POA: Diagnosis present

## 2023-06-20 DIAGNOSIS — Z85850 Personal history of malignant neoplasm of thyroid: Secondary | ICD-10-CM | POA: Diagnosis not present

## 2023-06-20 DIAGNOSIS — E109 Type 1 diabetes mellitus without complications: Secondary | ICD-10-CM | POA: Diagnosis not present

## 2023-06-20 DIAGNOSIS — E119 Type 2 diabetes mellitus without complications: Secondary | ICD-10-CM | POA: Diagnosis not present

## 2023-06-20 DIAGNOSIS — E1065 Type 1 diabetes mellitus with hyperglycemia: Secondary | ICD-10-CM | POA: Diagnosis present

## 2023-06-20 DIAGNOSIS — K759 Inflammatory liver disease, unspecified: Secondary | ICD-10-CM | POA: Diagnosis not present

## 2023-06-20 DIAGNOSIS — K4090 Unilateral inguinal hernia, without obstruction or gangrene, not specified as recurrent: Secondary | ICD-10-CM | POA: Diagnosis not present

## 2023-06-20 DIAGNOSIS — Z7982 Long term (current) use of aspirin: Secondary | ICD-10-CM | POA: Diagnosis not present

## 2023-06-20 DIAGNOSIS — C73 Malignant neoplasm of thyroid gland: Secondary | ICD-10-CM | POA: Diagnosis present

## 2023-06-20 DIAGNOSIS — K719 Toxic liver disease, unspecified: Secondary | ICD-10-CM | POA: Diagnosis present

## 2023-06-20 DIAGNOSIS — K219 Gastro-esophageal reflux disease without esophagitis: Secondary | ICD-10-CM | POA: Diagnosis present

## 2023-06-20 DIAGNOSIS — R945 Abnormal results of liver function studies: Secondary | ICD-10-CM | POA: Diagnosis not present

## 2023-06-24 DIAGNOSIS — R000 Tachycardia, unspecified: Secondary | ICD-10-CM | POA: Diagnosis not present

## 2023-06-26 DIAGNOSIS — Z85850 Personal history of malignant neoplasm of thyroid: Secondary | ICD-10-CM | POA: Diagnosis not present

## 2023-06-26 DIAGNOSIS — Z9641 Presence of insulin pump (external) (internal): Secondary | ICD-10-CM | POA: Diagnosis present

## 2023-06-26 DIAGNOSIS — R739 Hyperglycemia, unspecified: Secondary | ICD-10-CM | POA: Diagnosis not present

## 2023-06-26 DIAGNOSIS — R7989 Other specified abnormal findings of blood chemistry: Secondary | ICD-10-CM | POA: Diagnosis present

## 2023-06-26 DIAGNOSIS — E785 Hyperlipidemia, unspecified: Secondary | ICD-10-CM | POA: Diagnosis present

## 2023-06-26 DIAGNOSIS — E109 Type 1 diabetes mellitus without complications: Secondary | ICD-10-CM | POA: Diagnosis present

## 2023-06-26 DIAGNOSIS — K219 Gastro-esophageal reflux disease without esophagitis: Secondary | ICD-10-CM | POA: Diagnosis present

## 2023-06-26 DIAGNOSIS — K759 Inflammatory liver disease, unspecified: Secondary | ICD-10-CM | POA: Diagnosis not present

## 2023-06-26 DIAGNOSIS — S36119A Unspecified injury of liver, initial encounter: Secondary | ICD-10-CM | POA: Diagnosis not present

## 2023-06-26 DIAGNOSIS — E890 Postprocedural hypothyroidism: Secondary | ICD-10-CM | POA: Diagnosis present

## 2023-06-26 DIAGNOSIS — D6959 Other secondary thrombocytopenia: Secondary | ICD-10-CM | POA: Diagnosis present

## 2023-06-26 DIAGNOSIS — Z7982 Long term (current) use of aspirin: Secondary | ICD-10-CM | POA: Diagnosis not present

## 2023-06-26 DIAGNOSIS — Z794 Long term (current) use of insulin: Secondary | ICD-10-CM | POA: Diagnosis not present

## 2023-06-26 DIAGNOSIS — T380X5A Adverse effect of glucocorticoids and synthetic analogues, initial encounter: Secondary | ICD-10-CM | POA: Diagnosis not present

## 2023-06-26 DIAGNOSIS — Z79899 Other long term (current) drug therapy: Secondary | ICD-10-CM | POA: Diagnosis not present

## 2023-06-26 DIAGNOSIS — R768 Other specified abnormal immunological findings in serum: Secondary | ICD-10-CM | POA: Diagnosis present

## 2023-06-26 DIAGNOSIS — C73 Malignant neoplasm of thyroid gland: Secondary | ICD-10-CM | POA: Diagnosis present

## 2023-06-26 DIAGNOSIS — K754 Autoimmune hepatitis: Secondary | ICD-10-CM | POA: Diagnosis present

## 2023-06-26 DIAGNOSIS — Z4681 Encounter for fitting and adjustment of insulin pump: Secondary | ICD-10-CM | POA: Diagnosis not present

## 2023-06-26 DIAGNOSIS — D689 Coagulation defect, unspecified: Secondary | ICD-10-CM | POA: Diagnosis present

## 2023-06-26 DIAGNOSIS — R188 Other ascites: Secondary | ICD-10-CM | POA: Diagnosis present

## 2023-06-26 DIAGNOSIS — Z79890 Hormone replacement therapy (postmenopausal): Secondary | ICD-10-CM | POA: Diagnosis not present

## 2023-06-26 DIAGNOSIS — Z923 Personal history of irradiation: Secondary | ICD-10-CM | POA: Diagnosis not present

## 2023-06-26 DIAGNOSIS — E1065 Type 1 diabetes mellitus with hyperglycemia: Secondary | ICD-10-CM | POA: Diagnosis not present

## 2023-06-27 DIAGNOSIS — K754 Autoimmune hepatitis: Secondary | ICD-10-CM | POA: Diagnosis not present

## 2023-07-08 DIAGNOSIS — E109 Type 1 diabetes mellitus without complications: Secondary | ICD-10-CM | POA: Diagnosis not present

## 2023-07-08 DIAGNOSIS — E1040 Type 1 diabetes mellitus with diabetic neuropathy, unspecified: Secondary | ICD-10-CM | POA: Diagnosis not present

## 2023-07-08 DIAGNOSIS — E871 Hypo-osmolality and hyponatremia: Secondary | ICD-10-CM | POA: Diagnosis not present

## 2023-07-08 DIAGNOSIS — R760 Raised antibody titer: Secondary | ICD-10-CM | POA: Diagnosis not present

## 2023-07-08 DIAGNOSIS — K754 Autoimmune hepatitis: Secondary | ICD-10-CM | POA: Diagnosis not present

## 2023-07-08 DIAGNOSIS — D649 Anemia, unspecified: Secondary | ICD-10-CM | POA: Diagnosis not present

## 2023-07-08 DIAGNOSIS — R609 Edema, unspecified: Secondary | ICD-10-CM | POA: Diagnosis not present

## 2023-07-08 DIAGNOSIS — E441 Mild protein-calorie malnutrition: Secondary | ICD-10-CM | POA: Diagnosis not present

## 2023-07-08 DIAGNOSIS — Z7952 Long term (current) use of systemic steroids: Secondary | ICD-10-CM | POA: Diagnosis not present

## 2023-07-08 DIAGNOSIS — Z794 Long term (current) use of insulin: Secondary | ICD-10-CM | POA: Diagnosis not present

## 2023-07-08 DIAGNOSIS — R748 Abnormal levels of other serum enzymes: Secondary | ICD-10-CM | POA: Diagnosis not present

## 2023-07-09 DIAGNOSIS — K754 Autoimmune hepatitis: Secondary | ICD-10-CM | POA: Diagnosis not present

## 2023-07-09 DIAGNOSIS — D6949 Other primary thrombocytopenia: Secondary | ICD-10-CM | POA: Diagnosis not present

## 2023-07-22 DIAGNOSIS — E890 Postprocedural hypothyroidism: Secondary | ICD-10-CM | POA: Diagnosis not present

## 2023-07-22 DIAGNOSIS — K754 Autoimmune hepatitis: Secondary | ICD-10-CM | POA: Diagnosis not present

## 2023-07-24 DIAGNOSIS — N390 Urinary tract infection, site not specified: Secondary | ICD-10-CM | POA: Diagnosis not present

## 2023-07-28 DIAGNOSIS — C73 Malignant neoplasm of thyroid gland: Secondary | ICD-10-CM | POA: Diagnosis not present

## 2023-07-28 DIAGNOSIS — E109 Type 1 diabetes mellitus without complications: Secondary | ICD-10-CM | POA: Diagnosis not present

## 2023-07-28 DIAGNOSIS — R490 Dysphonia: Secondary | ICD-10-CM | POA: Diagnosis not present

## 2023-07-28 DIAGNOSIS — J3800 Paralysis of vocal cords and larynx, unspecified: Secondary | ICD-10-CM | POA: Diagnosis not present

## 2023-08-01 DIAGNOSIS — E119 Type 2 diabetes mellitus without complications: Secondary | ICD-10-CM | POA: Diagnosis not present

## 2023-08-06 DIAGNOSIS — R600 Localized edema: Secondary | ICD-10-CM | POA: Diagnosis not present

## 2023-08-06 DIAGNOSIS — K754 Autoimmune hepatitis: Secondary | ICD-10-CM | POA: Diagnosis not present

## 2023-08-14 DIAGNOSIS — E109 Type 1 diabetes mellitus without complications: Secondary | ICD-10-CM | POA: Diagnosis not present

## 2023-08-14 DIAGNOSIS — K754 Autoimmune hepatitis: Secondary | ICD-10-CM | POA: Diagnosis not present

## 2023-08-14 DIAGNOSIS — Z7952 Long term (current) use of systemic steroids: Secondary | ICD-10-CM | POA: Diagnosis not present

## 2023-08-14 DIAGNOSIS — E441 Mild protein-calorie malnutrition: Secondary | ICD-10-CM | POA: Diagnosis not present

## 2023-08-14 DIAGNOSIS — Z23 Encounter for immunization: Secondary | ICD-10-CM | POA: Diagnosis not present

## 2023-08-14 DIAGNOSIS — E871 Hypo-osmolality and hyponatremia: Secondary | ICD-10-CM | POA: Diagnosis not present

## 2023-08-14 DIAGNOSIS — R748 Abnormal levels of other serum enzymes: Secondary | ICD-10-CM | POA: Diagnosis not present

## 2023-08-14 DIAGNOSIS — D649 Anemia, unspecified: Secondary | ICD-10-CM | POA: Diagnosis not present

## 2023-08-14 DIAGNOSIS — Z794 Long term (current) use of insulin: Secondary | ICD-10-CM | POA: Diagnosis not present

## 2023-08-14 DIAGNOSIS — R760 Raised antibody titer: Secondary | ICD-10-CM | POA: Diagnosis not present

## 2023-08-14 DIAGNOSIS — R609 Edema, unspecified: Secondary | ICD-10-CM | POA: Diagnosis not present

## 2023-08-21 DIAGNOSIS — E890 Postprocedural hypothyroidism: Secondary | ICD-10-CM | POA: Diagnosis not present

## 2023-08-21 DIAGNOSIS — K754 Autoimmune hepatitis: Secondary | ICD-10-CM | POA: Diagnosis not present

## 2023-08-21 DIAGNOSIS — C73 Malignant neoplasm of thyroid gland: Secondary | ICD-10-CM | POA: Diagnosis not present

## 2023-08-26 DIAGNOSIS — R600 Localized edema: Secondary | ICD-10-CM | POA: Diagnosis not present

## 2023-08-26 DIAGNOSIS — S81801A Unspecified open wound, right lower leg, initial encounter: Secondary | ICD-10-CM | POA: Diagnosis not present

## 2023-08-26 DIAGNOSIS — N390 Urinary tract infection, site not specified: Secondary | ICD-10-CM | POA: Diagnosis not present

## 2023-08-26 DIAGNOSIS — K754 Autoimmune hepatitis: Secondary | ICD-10-CM | POA: Diagnosis not present

## 2023-08-28 DIAGNOSIS — C73 Malignant neoplasm of thyroid gland: Secondary | ICD-10-CM | POA: Diagnosis not present

## 2023-09-03 DIAGNOSIS — K754 Autoimmune hepatitis: Secondary | ICD-10-CM | POA: Diagnosis not present

## 2023-09-16 DIAGNOSIS — E890 Postprocedural hypothyroidism: Secondary | ICD-10-CM | POA: Diagnosis not present

## 2023-09-16 DIAGNOSIS — K754 Autoimmune hepatitis: Secondary | ICD-10-CM | POA: Diagnosis not present

## 2023-09-28 DIAGNOSIS — R300 Dysuria: Secondary | ICD-10-CM | POA: Diagnosis not present

## 2023-10-09 DIAGNOSIS — K754 Autoimmune hepatitis: Secondary | ICD-10-CM | POA: Diagnosis not present

## 2023-10-13 DIAGNOSIS — K754 Autoimmune hepatitis: Secondary | ICD-10-CM | POA: Diagnosis not present

## 2023-10-17 DIAGNOSIS — Z9089 Acquired absence of other organs: Secondary | ICD-10-CM | POA: Diagnosis not present

## 2023-10-17 DIAGNOSIS — D508 Other iron deficiency anemias: Secondary | ICD-10-CM | POA: Diagnosis not present

## 2023-10-17 DIAGNOSIS — D696 Thrombocytopenia, unspecified: Secondary | ICD-10-CM | POA: Diagnosis not present

## 2023-10-17 DIAGNOSIS — K754 Autoimmune hepatitis: Secondary | ICD-10-CM | POA: Diagnosis not present

## 2023-10-17 DIAGNOSIS — Z01818 Encounter for other preprocedural examination: Secondary | ICD-10-CM | POA: Diagnosis not present

## 2023-10-17 DIAGNOSIS — K219 Gastro-esophageal reflux disease without esophagitis: Secondary | ICD-10-CM | POA: Diagnosis not present

## 2023-10-17 DIAGNOSIS — J3802 Paralysis of vocal cords and larynx, bilateral: Secondary | ICD-10-CM | POA: Diagnosis not present

## 2023-10-17 DIAGNOSIS — E7849 Other hyperlipidemia: Secondary | ICD-10-CM | POA: Diagnosis not present

## 2023-10-17 DIAGNOSIS — E109 Type 1 diabetes mellitus without complications: Secondary | ICD-10-CM | POA: Diagnosis not present

## 2023-10-17 DIAGNOSIS — Z85850 Personal history of malignant neoplasm of thyroid: Secondary | ICD-10-CM | POA: Diagnosis not present

## 2023-10-17 DIAGNOSIS — E785 Hyperlipidemia, unspecified: Secondary | ICD-10-CM | POA: Diagnosis not present

## 2023-10-23 DIAGNOSIS — K219 Gastro-esophageal reflux disease without esophagitis: Secondary | ICD-10-CM | POA: Diagnosis not present

## 2023-10-23 DIAGNOSIS — K648 Other hemorrhoids: Secondary | ICD-10-CM | POA: Diagnosis not present

## 2023-10-23 DIAGNOSIS — E890 Postprocedural hypothyroidism: Secondary | ICD-10-CM | POA: Diagnosis not present

## 2023-10-23 DIAGNOSIS — D6949 Other primary thrombocytopenia: Secondary | ICD-10-CM | POA: Diagnosis not present

## 2023-10-23 DIAGNOSIS — R490 Dysphonia: Secondary | ICD-10-CM | POA: Diagnosis not present

## 2023-10-23 DIAGNOSIS — Z85850 Personal history of malignant neoplasm of thyroid: Secondary | ICD-10-CM | POA: Diagnosis not present

## 2023-10-23 DIAGNOSIS — J3800 Paralysis of vocal cords and larynx, unspecified: Secondary | ICD-10-CM | POA: Diagnosis not present

## 2023-10-23 DIAGNOSIS — E109 Type 1 diabetes mellitus without complications: Secondary | ICD-10-CM | POA: Diagnosis not present

## 2023-10-23 DIAGNOSIS — K754 Autoimmune hepatitis: Secondary | ICD-10-CM | POA: Diagnosis not present

## 2023-10-23 DIAGNOSIS — Z794 Long term (current) use of insulin: Secondary | ICD-10-CM | POA: Diagnosis not present

## 2023-10-23 DIAGNOSIS — J3802 Paralysis of vocal cords and larynx, bilateral: Secondary | ICD-10-CM | POA: Diagnosis not present

## 2023-10-28 DIAGNOSIS — K754 Autoimmune hepatitis: Secondary | ICD-10-CM | POA: Diagnosis not present

## 2023-10-30 DIAGNOSIS — M8580 Other specified disorders of bone density and structure, unspecified site: Secondary | ICD-10-CM | POA: Diagnosis not present

## 2023-10-30 DIAGNOSIS — Z7952 Long term (current) use of systemic steroids: Secondary | ICD-10-CM | POA: Diagnosis not present

## 2023-10-30 DIAGNOSIS — E1040 Type 1 diabetes mellitus with diabetic neuropathy, unspecified: Secondary | ICD-10-CM | POA: Diagnosis not present

## 2023-10-30 DIAGNOSIS — K754 Autoimmune hepatitis: Secondary | ICD-10-CM | POA: Diagnosis not present

## 2023-10-30 DIAGNOSIS — R490 Dysphonia: Secondary | ICD-10-CM | POA: Diagnosis not present

## 2023-10-30 DIAGNOSIS — C73 Malignant neoplasm of thyroid gland: Secondary | ICD-10-CM | POA: Diagnosis not present

## 2023-10-30 DIAGNOSIS — R748 Abnormal levels of other serum enzymes: Secondary | ICD-10-CM | POA: Diagnosis not present

## 2023-10-30 DIAGNOSIS — E039 Hypothyroidism, unspecified: Secondary | ICD-10-CM | POA: Diagnosis not present

## 2023-10-30 DIAGNOSIS — R760 Raised antibody titer: Secondary | ICD-10-CM | POA: Diagnosis not present

## 2023-10-30 DIAGNOSIS — D649 Anemia, unspecified: Secondary | ICD-10-CM | POA: Diagnosis not present

## 2023-10-30 DIAGNOSIS — Z794 Long term (current) use of insulin: Secondary | ICD-10-CM | POA: Diagnosis not present

## 2023-10-30 DIAGNOSIS — R609 Edema, unspecified: Secondary | ICD-10-CM | POA: Diagnosis not present

## 2023-11-11 DIAGNOSIS — K754 Autoimmune hepatitis: Secondary | ICD-10-CM | POA: Diagnosis not present

## 2023-11-19 DIAGNOSIS — C73 Malignant neoplasm of thyroid gland: Secondary | ICD-10-CM | POA: Diagnosis not present

## 2023-11-24 DIAGNOSIS — H04123 Dry eye syndrome of bilateral lacrimal glands: Secondary | ICD-10-CM | POA: Diagnosis not present

## 2023-11-25 DIAGNOSIS — K754 Autoimmune hepatitis: Secondary | ICD-10-CM | POA: Diagnosis not present

## 2023-11-27 DIAGNOSIS — Z9889 Other specified postprocedural states: Secondary | ICD-10-CM | POA: Diagnosis not present

## 2023-11-27 DIAGNOSIS — Z85850 Personal history of malignant neoplasm of thyroid: Secondary | ICD-10-CM | POA: Diagnosis not present

## 2023-11-27 DIAGNOSIS — J3801 Paralysis of vocal cords and larynx, unilateral: Secondary | ICD-10-CM | POA: Diagnosis not present

## 2023-11-27 DIAGNOSIS — C73 Malignant neoplasm of thyroid gland: Secondary | ICD-10-CM | POA: Diagnosis not present

## 2023-11-27 DIAGNOSIS — K754 Autoimmune hepatitis: Secondary | ICD-10-CM | POA: Diagnosis not present

## 2023-11-27 DIAGNOSIS — Z08 Encounter for follow-up examination after completed treatment for malignant neoplasm: Secondary | ICD-10-CM | POA: Diagnosis not present

## 2023-12-01 DIAGNOSIS — C73 Malignant neoplasm of thyroid gland: Secondary | ICD-10-CM | POA: Diagnosis not present

## 2023-12-05 DIAGNOSIS — Z9889 Other specified postprocedural states: Secondary | ICD-10-CM | POA: Diagnosis not present

## 2023-12-05 DIAGNOSIS — Z923 Personal history of irradiation: Secondary | ICD-10-CM | POA: Diagnosis not present

## 2023-12-05 DIAGNOSIS — C73 Malignant neoplasm of thyroid gland: Secondary | ICD-10-CM | POA: Diagnosis not present

## 2023-12-05 DIAGNOSIS — Z9089 Acquired absence of other organs: Secondary | ICD-10-CM | POA: Diagnosis not present

## 2023-12-05 DIAGNOSIS — I890 Lymphedema, not elsewhere classified: Secondary | ICD-10-CM | POA: Diagnosis not present

## 2023-12-09 DIAGNOSIS — K754 Autoimmune hepatitis: Secondary | ICD-10-CM | POA: Diagnosis not present

## 2023-12-17 DIAGNOSIS — J3801 Paralysis of vocal cords and larynx, unilateral: Secondary | ICD-10-CM | POA: Diagnosis not present

## 2023-12-17 DIAGNOSIS — R490 Dysphonia: Secondary | ICD-10-CM | POA: Diagnosis not present

## 2023-12-17 DIAGNOSIS — J3800 Paralysis of vocal cords and larynx, unspecified: Secondary | ICD-10-CM | POA: Diagnosis not present

## 2023-12-23 DIAGNOSIS — K754 Autoimmune hepatitis: Secondary | ICD-10-CM | POA: Diagnosis not present

## 2024-01-08 DIAGNOSIS — G8929 Other chronic pain: Secondary | ICD-10-CM | POA: Diagnosis not present

## 2024-01-08 DIAGNOSIS — M25511 Pain in right shoulder: Secondary | ICD-10-CM | POA: Diagnosis not present

## 2024-01-12 DIAGNOSIS — K754 Autoimmune hepatitis: Secondary | ICD-10-CM | POA: Diagnosis not present

## 2024-01-14 DIAGNOSIS — D61818 Other pancytopenia: Secondary | ICD-10-CM | POA: Diagnosis not present

## 2024-01-14 DIAGNOSIS — R7989 Other specified abnormal findings of blood chemistry: Secondary | ICD-10-CM | POA: Diagnosis not present

## 2024-01-14 DIAGNOSIS — Z5181 Encounter for therapeutic drug level monitoring: Secondary | ICD-10-CM | POA: Diagnosis not present

## 2024-01-14 DIAGNOSIS — K754 Autoimmune hepatitis: Secondary | ICD-10-CM | POA: Diagnosis not present

## 2024-01-28 DIAGNOSIS — K754 Autoimmune hepatitis: Secondary | ICD-10-CM | POA: Diagnosis not present

## 2024-01-29 DIAGNOSIS — R8281 Pyuria: Secondary | ICD-10-CM | POA: Diagnosis not present

## 2024-01-29 DIAGNOSIS — E785 Hyperlipidemia, unspecified: Secondary | ICD-10-CM | POA: Diagnosis not present

## 2024-01-29 DIAGNOSIS — E109 Type 1 diabetes mellitus without complications: Secondary | ICD-10-CM | POA: Diagnosis not present

## 2024-01-29 DIAGNOSIS — Z1389 Encounter for screening for other disorder: Secondary | ICD-10-CM | POA: Diagnosis not present

## 2024-01-29 DIAGNOSIS — E441 Mild protein-calorie malnutrition: Secondary | ICD-10-CM | POA: Diagnosis not present

## 2024-01-29 DIAGNOSIS — E039 Hypothyroidism, unspecified: Secondary | ICD-10-CM | POA: Diagnosis not present

## 2024-01-29 DIAGNOSIS — E559 Vitamin D deficiency, unspecified: Secondary | ICD-10-CM | POA: Diagnosis not present

## 2024-01-29 DIAGNOSIS — Z0000 Encounter for general adult medical examination without abnormal findings: Secondary | ICD-10-CM | POA: Diagnosis not present

## 2024-01-29 DIAGNOSIS — N390 Urinary tract infection, site not specified: Secondary | ICD-10-CM | POA: Diagnosis not present

## 2024-02-05 DIAGNOSIS — E1040 Type 1 diabetes mellitus with diabetic neuropathy, unspecified: Secondary | ICD-10-CM | POA: Diagnosis not present

## 2024-02-05 DIAGNOSIS — C73 Malignant neoplasm of thyroid gland: Secondary | ICD-10-CM | POA: Diagnosis not present

## 2024-02-05 DIAGNOSIS — Z1331 Encounter for screening for depression: Secondary | ICD-10-CM | POA: Diagnosis not present

## 2024-02-05 DIAGNOSIS — R609 Edema, unspecified: Secondary | ICD-10-CM | POA: Diagnosis not present

## 2024-02-05 DIAGNOSIS — R490 Dysphonia: Secondary | ICD-10-CM | POA: Diagnosis not present

## 2024-02-05 DIAGNOSIS — Z1339 Encounter for screening examination for other mental health and behavioral disorders: Secondary | ICD-10-CM | POA: Diagnosis not present

## 2024-02-05 DIAGNOSIS — E039 Hypothyroidism, unspecified: Secondary | ICD-10-CM | POA: Diagnosis not present

## 2024-02-05 DIAGNOSIS — D649 Anemia, unspecified: Secondary | ICD-10-CM | POA: Diagnosis not present

## 2024-02-05 DIAGNOSIS — R760 Raised antibody titer: Secondary | ICD-10-CM | POA: Diagnosis not present

## 2024-02-05 DIAGNOSIS — R748 Abnormal levels of other serum enzymes: Secondary | ICD-10-CM | POA: Diagnosis not present

## 2024-02-05 DIAGNOSIS — Z794 Long term (current) use of insulin: Secondary | ICD-10-CM | POA: Diagnosis not present

## 2024-02-05 DIAGNOSIS — Z0000 Encounter for general adult medical examination without abnormal findings: Secondary | ICD-10-CM | POA: Diagnosis not present

## 2024-02-18 DIAGNOSIS — C73 Malignant neoplasm of thyroid gland: Secondary | ICD-10-CM | POA: Diagnosis not present

## 2024-02-18 DIAGNOSIS — E890 Postprocedural hypothyroidism: Secondary | ICD-10-CM | POA: Diagnosis not present

## 2024-02-23 DIAGNOSIS — N390 Urinary tract infection, site not specified: Secondary | ICD-10-CM | POA: Diagnosis not present

## 2024-02-25 DIAGNOSIS — N952 Postmenopausal atrophic vaginitis: Secondary | ICD-10-CM | POA: Diagnosis not present

## 2024-02-25 DIAGNOSIS — R300 Dysuria: Secondary | ICD-10-CM | POA: Diagnosis not present

## 2024-02-25 DIAGNOSIS — N8111 Cystocele, midline: Secondary | ICD-10-CM | POA: Diagnosis not present

## 2024-02-25 DIAGNOSIS — R102 Pelvic and perineal pain: Secondary | ICD-10-CM | POA: Diagnosis not present

## 2024-03-02 DIAGNOSIS — K754 Autoimmune hepatitis: Secondary | ICD-10-CM | POA: Diagnosis not present

## 2024-04-05 DIAGNOSIS — K754 Autoimmune hepatitis: Secondary | ICD-10-CM | POA: Diagnosis not present

## 2024-04-06 DIAGNOSIS — N952 Postmenopausal atrophic vaginitis: Secondary | ICD-10-CM | POA: Diagnosis not present

## 2024-04-06 DIAGNOSIS — R300 Dysuria: Secondary | ICD-10-CM | POA: Diagnosis not present

## 2024-04-06 DIAGNOSIS — R102 Pelvic and perineal pain: Secondary | ICD-10-CM | POA: Diagnosis not present

## 2024-04-06 DIAGNOSIS — N8111 Cystocele, midline: Secondary | ICD-10-CM | POA: Diagnosis not present

## 2024-04-08 DIAGNOSIS — Z794 Long term (current) use of insulin: Secondary | ICD-10-CM | POA: Diagnosis not present

## 2024-04-08 DIAGNOSIS — E119 Type 2 diabetes mellitus without complications: Secondary | ICD-10-CM | POA: Diagnosis not present

## 2024-04-08 DIAGNOSIS — J3801 Paralysis of vocal cords and larynx, unilateral: Secondary | ICD-10-CM | POA: Diagnosis not present

## 2024-04-08 DIAGNOSIS — Z9641 Presence of insulin pump (external) (internal): Secondary | ICD-10-CM | POA: Diagnosis not present

## 2024-04-08 DIAGNOSIS — Z85850 Personal history of malignant neoplasm of thyroid: Secondary | ICD-10-CM | POA: Diagnosis not present

## 2024-04-08 DIAGNOSIS — R221 Localized swelling, mass and lump, neck: Secondary | ICD-10-CM | POA: Diagnosis not present

## 2024-05-10 DIAGNOSIS — E039 Hypothyroidism, unspecified: Secondary | ICD-10-CM | POA: Diagnosis not present

## 2024-05-10 DIAGNOSIS — R490 Dysphonia: Secondary | ICD-10-CM | POA: Diagnosis not present

## 2024-05-10 DIAGNOSIS — Z794 Long term (current) use of insulin: Secondary | ICD-10-CM | POA: Diagnosis not present

## 2024-05-10 DIAGNOSIS — R760 Raised antibody titer: Secondary | ICD-10-CM | POA: Diagnosis not present

## 2024-05-10 DIAGNOSIS — R748 Abnormal levels of other serum enzymes: Secondary | ICD-10-CM | POA: Diagnosis not present

## 2024-05-10 DIAGNOSIS — E1040 Type 1 diabetes mellitus with diabetic neuropathy, unspecified: Secondary | ICD-10-CM | POA: Diagnosis not present

## 2024-05-10 DIAGNOSIS — C73 Malignant neoplasm of thyroid gland: Secondary | ICD-10-CM | POA: Diagnosis not present

## 2024-05-12 DIAGNOSIS — K754 Autoimmune hepatitis: Secondary | ICD-10-CM | POA: Diagnosis not present

## 2024-05-21 DIAGNOSIS — K754 Autoimmune hepatitis: Secondary | ICD-10-CM | POA: Diagnosis not present

## 2024-05-26 DIAGNOSIS — C73 Malignant neoplasm of thyroid gland: Secondary | ICD-10-CM | POA: Diagnosis not present

## 2024-06-03 DIAGNOSIS — Z923 Personal history of irradiation: Secondary | ICD-10-CM | POA: Diagnosis not present

## 2024-06-03 DIAGNOSIS — R490 Dysphonia: Secondary | ICD-10-CM | POA: Diagnosis not present

## 2024-06-03 DIAGNOSIS — Z85850 Personal history of malignant neoplasm of thyroid: Secondary | ICD-10-CM | POA: Diagnosis not present

## 2024-06-03 DIAGNOSIS — C73 Malignant neoplasm of thyroid gland: Secondary | ICD-10-CM | POA: Diagnosis not present

## 2024-06-03 DIAGNOSIS — I890 Lymphedema, not elsewhere classified: Secondary | ICD-10-CM | POA: Diagnosis not present

## 2024-06-08 DIAGNOSIS — R92323 Mammographic fibroglandular density, bilateral breasts: Secondary | ICD-10-CM | POA: Diagnosis not present

## 2024-06-08 DIAGNOSIS — Z1231 Encounter for screening mammogram for malignant neoplasm of breast: Secondary | ICD-10-CM | POA: Diagnosis not present

## 2024-06-17 DIAGNOSIS — C73 Malignant neoplasm of thyroid gland: Secondary | ICD-10-CM | POA: Diagnosis not present

## 2024-06-17 DIAGNOSIS — I890 Lymphedema, not elsewhere classified: Secondary | ICD-10-CM | POA: Diagnosis not present

## 2024-06-23 DIAGNOSIS — L814 Other melanin hyperpigmentation: Secondary | ICD-10-CM | POA: Diagnosis not present

## 2024-06-23 DIAGNOSIS — C44712 Basal cell carcinoma of skin of right lower limb, including hip: Secondary | ICD-10-CM | POA: Diagnosis not present

## 2024-06-23 DIAGNOSIS — L578 Other skin changes due to chronic exposure to nonionizing radiation: Secondary | ICD-10-CM | POA: Diagnosis not present

## 2024-06-23 DIAGNOSIS — L821 Other seborrheic keratosis: Secondary | ICD-10-CM | POA: Diagnosis not present

## 2024-06-23 DIAGNOSIS — D225 Melanocytic nevi of trunk: Secondary | ICD-10-CM | POA: Diagnosis not present

## 2024-06-24 DIAGNOSIS — C73 Malignant neoplasm of thyroid gland: Secondary | ICD-10-CM | POA: Diagnosis not present

## 2024-06-24 DIAGNOSIS — I890 Lymphedema, not elsewhere classified: Secondary | ICD-10-CM | POA: Diagnosis not present

## 2024-06-30 DIAGNOSIS — C44712 Basal cell carcinoma of skin of right lower limb, including hip: Secondary | ICD-10-CM | POA: Diagnosis not present

## 2024-07-06 DIAGNOSIS — Z01419 Encounter for gynecological examination (general) (routine) without abnormal findings: Secondary | ICD-10-CM | POA: Diagnosis not present

## 2024-07-07 DIAGNOSIS — I890 Lymphedema, not elsewhere classified: Secondary | ICD-10-CM | POA: Diagnosis not present

## 2024-07-07 DIAGNOSIS — C73 Malignant neoplasm of thyroid gland: Secondary | ICD-10-CM | POA: Diagnosis not present

## 2024-07-12 DIAGNOSIS — M25511 Pain in right shoulder: Secondary | ICD-10-CM | POA: Diagnosis not present

## 2024-07-12 DIAGNOSIS — G8929 Other chronic pain: Secondary | ICD-10-CM | POA: Diagnosis not present

## 2024-07-16 DIAGNOSIS — R300 Dysuria: Secondary | ICD-10-CM | POA: Diagnosis not present

## 2024-07-16 DIAGNOSIS — N8111 Cystocele, midline: Secondary | ICD-10-CM | POA: Diagnosis not present

## 2024-07-16 DIAGNOSIS — N952 Postmenopausal atrophic vaginitis: Secondary | ICD-10-CM | POA: Diagnosis not present

## 2024-07-28 DIAGNOSIS — D61818 Other pancytopenia: Secondary | ICD-10-CM | POA: Diagnosis not present

## 2024-07-28 DIAGNOSIS — J90 Pleural effusion, not elsewhere classified: Secondary | ICD-10-CM | POA: Diagnosis not present

## 2024-07-28 DIAGNOSIS — K754 Autoimmune hepatitis: Secondary | ICD-10-CM | POA: Diagnosis not present

## 2024-08-04 DIAGNOSIS — H524 Presbyopia: Secondary | ICD-10-CM | POA: Diagnosis not present

## 2024-08-04 DIAGNOSIS — E119 Type 2 diabetes mellitus without complications: Secondary | ICD-10-CM | POA: Diagnosis not present

## 2024-08-05 DIAGNOSIS — R918 Other nonspecific abnormal finding of lung field: Secondary | ICD-10-CM | POA: Diagnosis not present

## 2024-08-09 DIAGNOSIS — C73 Malignant neoplasm of thyroid gland: Secondary | ICD-10-CM | POA: Diagnosis not present

## 2024-08-09 DIAGNOSIS — Z23 Encounter for immunization: Secondary | ICD-10-CM | POA: Diagnosis not present

## 2024-08-09 DIAGNOSIS — E1040 Type 1 diabetes mellitus with diabetic neuropathy, unspecified: Secondary | ICD-10-CM | POA: Diagnosis not present

## 2024-08-09 DIAGNOSIS — Z794 Long term (current) use of insulin: Secondary | ICD-10-CM | POA: Diagnosis not present

## 2024-08-09 DIAGNOSIS — R748 Abnormal levels of other serum enzymes: Secondary | ICD-10-CM | POA: Diagnosis not present

## 2024-08-09 DIAGNOSIS — R760 Raised antibody titer: Secondary | ICD-10-CM | POA: Diagnosis not present

## 2024-08-09 DIAGNOSIS — K754 Autoimmune hepatitis: Secondary | ICD-10-CM | POA: Diagnosis not present

## 2024-08-09 DIAGNOSIS — R490 Dysphonia: Secondary | ICD-10-CM | POA: Diagnosis not present

## 2024-08-09 DIAGNOSIS — E039 Hypothyroidism, unspecified: Secondary | ICD-10-CM | POA: Diagnosis not present

## 2024-08-09 DIAGNOSIS — J90 Pleural effusion, not elsewhere classified: Secondary | ICD-10-CM | POA: Diagnosis not present

## 2024-08-24 DIAGNOSIS — C73 Malignant neoplasm of thyroid gland: Secondary | ICD-10-CM | POA: Diagnosis not present

## 2024-08-24 DIAGNOSIS — E890 Postprocedural hypothyroidism: Secondary | ICD-10-CM | POA: Diagnosis not present
